# Patient Record
Sex: Female | Born: 1955 | Race: White | Hispanic: No | Smoking: Never smoker
Health system: Southern US, Community
[De-identification: ages and names within clinical notes are randomized; demographics above are authoritative.]

## PROBLEM LIST (undated history)

## (undated) DIAGNOSIS — K5792 Diverticulitis of intestine, part unspecified, without perforation or abscess without bleeding: Secondary | ICD-10-CM

## (undated) DIAGNOSIS — C4491 Basal cell carcinoma of skin, unspecified: Secondary | ICD-10-CM

## (undated) DIAGNOSIS — Z87442 Personal history of urinary calculi: Secondary | ICD-10-CM

## (undated) DIAGNOSIS — M8580 Other specified disorders of bone density and structure, unspecified site: Secondary | ICD-10-CM

## (undated) HISTORY — DX: Diverticulitis of intestine, part unspecified, without perforation or abscess without bleeding: K57.92

## (undated) HISTORY — DX: Basal cell carcinoma of skin, unspecified: C44.91

## (undated) HISTORY — PX: OTHER SURGICAL HISTORY: SHX169

## (undated) HISTORY — DX: Personal history of urinary calculi: Z87.442

## (undated) HISTORY — DX: Other specified disorders of bone density and structure, unspecified site: M85.80

## (undated) HISTORY — PX: EYE SURGERY: SHX253

## (undated) HISTORY — PX: CERVICAL DISC SURGERY: SHX588

---

## 1998-04-15 ENCOUNTER — Other Ambulatory Visit: Admission: RE | Admit: 1998-04-15 | Discharge: 1998-04-15 | Payer: Self-pay | Admitting: *Deleted

## 1999-04-22 ENCOUNTER — Other Ambulatory Visit: Admission: RE | Admit: 1999-04-22 | Discharge: 1999-04-22 | Payer: Self-pay | Admitting: *Deleted

## 2001-06-01 ENCOUNTER — Other Ambulatory Visit: Admission: RE | Admit: 2001-06-01 | Discharge: 2001-06-01 | Payer: Self-pay | Admitting: *Deleted

## 2002-07-31 ENCOUNTER — Other Ambulatory Visit: Admission: RE | Admit: 2002-07-31 | Discharge: 2002-07-31 | Payer: Self-pay | Admitting: *Deleted

## 2004-10-30 ENCOUNTER — Emergency Department (HOSPITAL_COMMUNITY): Admission: EM | Admit: 2004-10-30 | Discharge: 2004-10-30 | Payer: Self-pay | Admitting: Emergency Medicine

## 2005-08-11 ENCOUNTER — Other Ambulatory Visit: Admission: RE | Admit: 2005-08-11 | Discharge: 2005-08-11 | Payer: Self-pay | Admitting: Gynecology

## 2006-01-17 HISTORY — PX: PELVIC LAPAROSCOPY: SHX162

## 2006-08-02 ENCOUNTER — Encounter: Payer: Self-pay | Admitting: Gynecology

## 2006-08-02 ENCOUNTER — Ambulatory Visit (HOSPITAL_BASED_OUTPATIENT_CLINIC_OR_DEPARTMENT_OTHER): Admission: RE | Admit: 2006-08-02 | Discharge: 2006-08-02 | Payer: Self-pay | Admitting: Gynecology

## 2006-08-16 ENCOUNTER — Other Ambulatory Visit: Admission: RE | Admit: 2006-08-16 | Discharge: 2006-08-16 | Payer: Self-pay | Admitting: Gynecology

## 2007-09-03 ENCOUNTER — Other Ambulatory Visit: Admission: RE | Admit: 2007-09-03 | Discharge: 2007-09-03 | Payer: Self-pay | Admitting: Gynecology

## 2007-12-04 ENCOUNTER — Ambulatory Visit: Payer: Self-pay | Admitting: Gynecology

## 2008-05-27 ENCOUNTER — Ambulatory Visit: Payer: Self-pay | Admitting: Gynecology

## 2008-09-10 ENCOUNTER — Ambulatory Visit: Payer: Self-pay | Admitting: Gynecology

## 2008-09-10 ENCOUNTER — Encounter: Payer: Self-pay | Admitting: Gynecology

## 2008-09-10 ENCOUNTER — Other Ambulatory Visit: Admission: RE | Admit: 2008-09-10 | Discharge: 2008-09-10 | Payer: Self-pay | Admitting: Gynecology

## 2009-07-16 ENCOUNTER — Ambulatory Visit: Payer: Self-pay | Admitting: Gynecology

## 2009-09-11 ENCOUNTER — Other Ambulatory Visit: Admission: RE | Admit: 2009-09-11 | Discharge: 2009-09-11 | Payer: Self-pay | Admitting: Gynecology

## 2009-09-11 ENCOUNTER — Ambulatory Visit: Payer: Self-pay | Admitting: Gynecology

## 2010-06-01 NOTE — H&P (Signed)
NAME:  Kaitlyn Tate, Kaitlyn Tate          ACCOUNT NO.:  [id number]   MEDICAL RECORD NO.:  [id number]          PATIENT TYPE:  AMB   LOCATION:  [facility]                         FACILITY:  [facility]   PHYSICIAN:  Timothy P. Fontaine, M.D.DATE OF BIRTH:  [birth year]   DATE OF ADMISSION:  08/02/2006  DATE OF DISCHARGE:                              HISTORY & PHYSICAL   CHIEF COMPLAINT:  Persistent left ovarian cystic masses.   HISTORY OF PRESENT ILLNESS:  This 55 year old gravida 0, barrier  contraception, presents with persistent left cystic ovarian masses.  The  patient originally presented approximately a year ago after having had a  CT scan due to some abdominal discomfort which showed some cystic  changes on her ovaries.  She ultimately underwent ultrasonography which  showed multiple left cystic changes solid in appearance with a  homogeneous echotexture.  These were all smaller, in the 16 mm range.  She initially was followed expectantly with followup ultrasounds  ultimately showing enlargement of these cystic solid areas, the largest  now measuring 34 mm.  She had a CA-125 that was mildly elevated at 45.  She is admitted at this time for laparoscopic assessment left salpingo-  oophorectomy.   PAST MEDICAL HISTORY:  History of renal lithiasis.   PAST SURGICAL HISTORY:  Cervical diskectomy times 2.  Bunionectomy.   ALLERGIES:  No medications.   CURRENT MEDICATIONS:  Aleve p.r.n.   REVIEW OF SYSTEMS:  Noncontributory.   FAMILY HISTORY:  Noncontributory.   SOCIAL HISTORY:  Noncontributory.   PHYSICAL EXAMINATION:  VITAL SIGNS:  Afebrile.  Vital signs are stable.  HEENT: Normal.  LUNGS:  Clear.  CARDIAC:  Regular rate.  No rubs, murmurs or gallops.  ABDOMEN:  Exam benign.  PELVIC:  External BUS, vagina normal.  Cervix normal.  Uterus is normal  size, midline and mobile, nontender.  Adnexa are without gross masses or  tenderness.   ASSESSMENT:  A 55 year old gravida 0 with persistent  mildly enlarging  left multicystic ovary, solid to homogeneous in nature, suspicious for  endometriomas.  CA-125 low-level positive again consistent with  endometriosis.  Options for management were reviewed with the patient to  include continued expectant management versus surgical intervention.  The patient elects for surgical intervention.  She wants a conservative  approach with planned laparoscopic assessment left oophorectomy.  She  understands that, if during the procedure overt carcinoma is  encountered, we will terminate the procedure at that time to allow for  gynecologic oncology consultation and rescheduling of a more appropriate  procedure.  If endometriosis is encountered, we will try to address  endometriosis as found, but we may not eliminate all of this due to its  position and for fear of injury to surrounding structures such as bowel,  bladder, ureters.  If assessment shows completely normal-appearing  ovaries, we are still going to proceed with a left salpingo-oophorectomy  given the long persistence of internal abnormalities, but we will take a  look at the right side regardless.  If there are overt cystic changes,  we will address these to resolve that these are not endometriomas or  pathology, but will intent  to leave her right ovary for continued  hormone production.  We also discussed the issues of rupture, that if it  would be cancer, whether this would or would not change prognosis and  she is comfortable again with a laparoscopic approach.  I reviewed the  expected intraoperative and postoperative courses to include the use of  the laparoscopic trocar placement, multiple port sites, insufflation,  use of sharp blunt dissection, electrocautery and possible use of laser.  The acute intraoperative and postoperative risks were reviewed to  include the risks of bleeding leading to hemorrhage necessitating  transfusion and the risks of transfusion including  transfusion reaction,  hepatitis, HIV, mad cow disease and other unknown entities, risk of  infection internal requiring prolonged antibiotics, incisional requiring  opening and draining of incisions, closure by secondary intention, long-  term risks of hernia formation.  The risks of inadvertent injury to  internal organs either immediately recognized or delay recognized  including bowel, bladder, ureters, vessels and nerves necessitating  major exploratory reparative surgeries, future reparative surgeries,  bowel resection, ostomy formation was all discussed, understood and  accepted.  The patient's questions were answered to her satisfaction.  She is ready to proceed with surgery.      Timothy P. Fontaine, M.D.  Electronically Signed     TPF/MEDQ  D:  07/28/2006  T:  07/29/2006  Job:  [id number]

## 2010-06-01 NOTE — Op Note (Signed)
NAME:  Kaitlyn Tate, Kaitlyn Tate          ACCOUNT NO.:  [id number]   MEDICAL RECORD NO.:  [id number]          PATIENT TYPE:  AMB   LOCATION:  [facility]                         FACILITY:  [facility]   PHYSICIAN:  Timothy P. Fontaine, M.D.DATE OF BIRTH:  [birth year]   DATE OF PROCEDURE:  08/02/2006  DATE OF DISCHARGE:                               OPERATIVE REPORT   PREOPERATIVE DIAGNOSES:  Left ovarian cystic mass.   POSTOPERATIVE DIAGNOSES:  Severe endometriosis.   PROCEDURE:  Diagnostic laparoscopy.   SURGEON:  Timothy P. Fontaine, M.D.   ASSISTANT:  Rande Brunt. Eda Paschal, M.D.   ANESTHESIA:  General.   PATHOLOGY:  Opening cell washing.   ESTIMATED BLOOD LOSS:  Minimal.   COMPLICATIONS:  None.   FINDINGS:  Anterior cul-de-sac with multiple areas of suspected  endometriosis, brown patchy to white plaque.  Posterior cul-de-sac  obliterated with the sigmoid colon adherent from the midline uterus  covering the entire left adnexa.  Peritoneal right sidewall adhesions  along the midline to right posterior mid-uterine wall, again  obliterating the posterior cul-de-sac.  Uterus grossly normal in size.  Small 2-cm anterior pedunculated leiomyomata.  Plaque-like suspected  endometriosis along the posterior uterine surface.  Right fallopian tube  grossly normal, puckered in the mid region, adherent to the posterior  uterine wall, not visualized, distal portion with normal fimbria.  Right  ovary adherent to the posterior uterine surface and right pelvic  sidewall with powder-burn-type implants, again limited inspection,  grossly normal.  Left adnexa:  Initial cystic area, which was freed, was  the distal fallopian tube with normal-appearing fimbria overlying a  cystic mass that was palpated via the probe, although could not be  clearly visualized.  During initial attempt for mobilization, the area  was entered and classic chocolate cyst material extruded; this was  irrigated out and again, could not  clearly visualize normal-appearing  ovarian tissue.  Sharp and blunt investigation revealed firmly-adherent  fibrosis along the posterior uterine aspect and at this point, it was  felt most prudent to abandon any further attempts at adnexal  immobilization and removal.  Her upper abdominal exam showed normal-  appearing appendix, free and mobile at the pelvic brim.  Liver was  smooth.  Gallbladder was not visualized.   PROCEDURE:  The patient was taken to the operating room, properly  identified, underwent general anesthesia, was placed in the low dorsal  lithotomy position, received abdominal, perineal and vaginal preparation  with Betadine solution, bladder emptied with an indwelling Foley  catheterization, EUA performed and a Hulka tenaculum was placed in the  cervix.  The patient was draped in the usual fashion.  A vertical  infraumbilical incision was made.  The 10-mm Optiview-type laparoscopic  trocar was placed under direct visualization and the abdomen was  subsequently insufflated without difficulty.  Right and left 5-mm  suprapubic ports were then placed under direct visualization after  transillumination for the vessels.  Examination of pelvic organs and  upper abdominal exam were carried out with findings noted above.  After  initial attempts at freeing the sigmoid and peritoneal adhesions, it was  felt due to the intense  fibrosis and fix scarring, that was then prudent  to proceed with further attempts at mobilization of the adnexa.  It was  clearly evident that this was a case of severe endometriosis with  endometrioma and the case was therefore terminated.  The patient was  placed in the supine position, the gas was slowly allowed to escape,  adequate hemostasis was visualized.  The right and left suprapubic ports  were removed under direct visualization, again showing adequate  hemostasis.  The infraumbilical port was then backed out under direct  visualization, showing  adequate hemostasis and no evidence of hernia  formation.  All skin incisions were then injected using 0.25% Marcaine.  A 0 Vicryl infraumbilical subcutaneous fascial stitch was placed and all  skin incisions closed with Dermabond skin adhesive.  The Hulka tenaculum  was removed, the Foley catheter was removed, the patient placed in  supine position, awakened without difficulty and taken to the recovery  room in good condition, having tolerated the procedure well.      Timothy P. Fontaine, M.D.  Electronically Signed     TPF/MEDQ  D:  08/02/2006  T:  08/03/2006  Job:  [id number]

## 2010-07-23 ENCOUNTER — Encounter: Payer: Self-pay | Admitting: *Deleted

## 2010-09-13 ENCOUNTER — Encounter: Payer: Self-pay | Admitting: Gynecology

## 2010-09-14 ENCOUNTER — Encounter: Payer: Self-pay | Admitting: Gynecology

## 2010-09-14 ENCOUNTER — Ambulatory Visit (INDEPENDENT_AMBULATORY_CARE_PROVIDER_SITE_OTHER): Payer: BC Managed Care – PPO | Admitting: Gynecology

## 2010-09-14 ENCOUNTER — Other Ambulatory Visit (HOSPITAL_COMMUNITY)
Admission: RE | Admit: 2010-09-14 | Discharge: 2010-09-14 | Disposition: A | Discharge status: Home / Self Care | Payer: BC Managed Care – PPO | Source: Ambulatory Visit | Attending: Gynecology | Admitting: Gynecology

## 2010-09-14 VITALS — BP 120/70 | Ht 64.75 in | Wt 140.0 lb

## 2010-09-14 DIAGNOSIS — Z01419 Encounter for gynecological examination (general) (routine) without abnormal findings: Secondary | ICD-10-CM

## 2010-09-14 NOTE — Progress Notes (Signed)
Kaitlyn Tate November 06, 1955 [id number]        55 y.o.  for annual exam.  Postmenopausal doing well some hot flashes which are treated with soy-based OTC.  Has history of significant endometriosis which is not an issue to her now.  Past medical history,surgical history, allergies, family history and social history were all reviewed and documented in the EPIC chart. ROS:  Was performed and pertinent positives and negatives are included in the history.  Exam: chaperone present Filed Vitals:   09/14/10 0937  BP: 120/70   General appearance  Normal Skin grossly normal Head/Neck normal with no cervical or supraclavicular adenopathy thyroid normal Lungs  clear Cardiac RR, without RMG Abdominal  soft, nontender, without masses, organomegaly or hernia Breasts  examined lying and sitting without masses, retractions, discharge or axillary adenopathy. Pelvic  Ext/BUS/vagina  normal mild atrophic changes noted  Cervix  normal  Pap done  Uterus  anteverted, normal size, shape and contour, midline and mobile nontender   Adnexa  Without masses or tenderness    Anus and perineum  normal   Rectovaginal  normal sphincter tone without palpated masses or tenderness.    Assessment/Plan:  55 y.o. female for annual exam.  Postmenopausal doing well no bleeding. She has mild atrophic genital changes. These are not an issue to her now.  Had mammography May 2012 will continue with annual mammography.  Up to date with colonoscopy. Recommended she schedule a baseline DEXA and she wants to arrange in [place] through her primary's office. She has an appointment to see them next week for her annual exam. No blood work was done today as it is all done through their office. Increase calcium vitamin D reviewed. Assuming she continues well she'll see me in a year sooner as needed    Dara Lords MD, 10:08 AM 09/14/2010

## 2011-05-18 DIAGNOSIS — M8580 Other specified disorders of bone density and structure, unspecified site: Secondary | ICD-10-CM

## 2011-05-18 HISTORY — DX: Other specified disorders of bone density and structure, unspecified site: M85.80

## 2011-09-15 ENCOUNTER — Encounter: Payer: Self-pay | Admitting: Gynecology

## 2011-09-15 ENCOUNTER — Ambulatory Visit (INDEPENDENT_AMBULATORY_CARE_PROVIDER_SITE_OTHER): Payer: BC Managed Care – PPO | Admitting: Gynecology

## 2011-09-15 VITALS — BP 120/70 | Ht 64.5 in | Wt 138.0 lb

## 2011-09-15 DIAGNOSIS — Z01419 Encounter for gynecological examination (general) (routine) without abnormal findings: Secondary | ICD-10-CM

## 2011-09-15 DIAGNOSIS — N952 Postmenopausal atrophic vaginitis: Secondary | ICD-10-CM

## 2011-09-15 DIAGNOSIS — N951 Menopausal and female climacteric states: Secondary | ICD-10-CM

## 2011-09-15 NOTE — Progress Notes (Signed)
Kaitlyn Tate 02-Sep-1955 [id number]        56 y.o.  G0P0 for annual exam.  Doing well without complaints.  Past medical history,surgical history, medications, allergies, family history and social history were all reviewed and documented in the EPIC chart. ROS:  Was performed and pertinent positives and negatives are included in the history.  Exam: Sherrilyn Rist assistant Filed Vitals:   09/15/11 0909  BP: 120/70  Height: 5' 4.5" (1.638 m)  Weight: 138 lb (62.596 kg)   General appearance  Normal Skin grossly normal Head/Neck normal with no cervical or supraclavicular adenopathy thyroid normal Lungs  clear Cardiac RR, without RMG Abdominal  soft, nontender, without masses, organomegaly or hernia Breasts  examined lying and sitting without masses, retractions, discharge or axillary adenopathy. Pelvic  Ext/BUS/vagina  normal with mild atrophic changes.  Cervix  normal   Uterus  anteverted, normal size, shape and contour, midline and mobile nontender   Adnexa  Without masses or tenderness    Anus and perineum  normal   Rectovaginal  normal sphincter tone without palpated masses or tenderness.    Assessment/Plan:  56 y.o. G0P0 female for annual exam.   1. Postmenopausal. Patient with some mild hot flushes. Better than last year. Overall doing well we'll continue to monitor. No bleeding. 2. History severe endometriosis. Patient asymptomatic will continue to monitor. 3. Osteopenia. DEXA this year elsewhere showed osteopenia. She's not sure the numbers have asked her to get me a copy of the report. She did discuss this with her primary physician who is treating her with calcium vitamin D supplementation. 4. Mammography. Patient had mammogram earlier this year. We'll continue with annual mammography. SBE monthly reviewed. 5. Pap smear. Last Pap smear 2012. No Pap smear done today. Patient has no history of abnormal Pap smears with numerous normal reports in her chart. Discussed current  screening guidelines and will plan every 3-5 year screening. 6. Colonoscopy. Patient had at age 56. The patient's sister did have colon cancer. She was recommended to repeat in a 10 year interval but my recommendation would be to readdress this with her gastroenterologist at age 56 as this may be more appropriate interval with the family history. 7. Health maintenance. Patient is followed by her primary annually. No blood work done today. Follow up one year, sooner as needed   Dara Lords MD, 9:35 AM 09/15/2011

## 2011-09-15 NOTE — Patient Instructions (Signed)
Follow up in one year for annual gynecologic exam. 

## 2011-09-16 LAB — URINALYSIS W MICROSCOPIC + REFLEX CULTURE
Bilirubin Urine: NEGATIVE
Casts: NONE SEEN
Crystals: NONE SEEN
Glucose, UA: NEGATIVE mg/dL
Hgb urine dipstick: NEGATIVE
Ketones, ur: NEGATIVE mg/dL
Leukocytes, UA: NEGATIVE
Nitrite: NEGATIVE
Protein, ur: NEGATIVE mg/dL
Specific Gravity, Urine: 1.021 (ref 1.005–1.030)
Squamous Epithelial / LPF: NONE SEEN
Urobilinogen, UA: 0.2 mg/dL (ref 0.0–1.0)
pH: 5.5 (ref 5.0–8.0)

## 2012-09-20 ENCOUNTER — Other Ambulatory Visit (HOSPITAL_COMMUNITY)
Admission: RE | Admit: 2012-09-20 | Discharge: 2012-09-20 | Disposition: A | Discharge status: Home / Self Care | Payer: BC Managed Care – PPO | Source: Ambulatory Visit | Attending: Gynecology | Admitting: Gynecology

## 2012-09-20 ENCOUNTER — Encounter: Payer: Self-pay | Admitting: Gynecology

## 2012-09-20 ENCOUNTER — Ambulatory Visit (INDEPENDENT_AMBULATORY_CARE_PROVIDER_SITE_OTHER): Payer: BC Managed Care – PPO | Admitting: Gynecology

## 2012-09-20 VITALS — BP 126/78 | Ht 64.5 in | Wt 139.2 lb

## 2012-09-20 DIAGNOSIS — M8580 Other specified disorders of bone density and structure, unspecified site: Secondary | ICD-10-CM

## 2012-09-20 DIAGNOSIS — Z01419 Encounter for gynecological examination (general) (routine) without abnormal findings: Secondary | ICD-10-CM

## 2012-09-20 DIAGNOSIS — M899 Disorder of bone, unspecified: Secondary | ICD-10-CM

## 2012-09-20 NOTE — Patient Instructions (Signed)
Follow up in one year for annual exam 

## 2012-09-20 NOTE — Addendum Note (Signed)
Addended by: Bertram Savin A on: 09/20/2012 09:34 AM   Modules accepted: Orders

## 2012-09-20 NOTE — Progress Notes (Signed)
Kaitlyn Tate 07-26-1955 [id number]        57 y.o.  G0P0 for annual exam.  Doing well without complaints.  Past medical history,surgical history, medications, allergies, family history and social history were all reviewed and documented in the EPIC chart.  ROS:  Performed and pertinent positives and negatives are included in the history, assessment and plan .  Exam: Kim assistant Filed Vitals:   09/20/12 0859  BP: 126/78  Height: 5' 4.5" (1.638 m)  Weight: 139 lb 3.2 oz (63.141 kg)   General appearance  Normal Skin grossly normal Head/Neck normal with no cervical or supraclavicular adenopathy thyroid normal Lungs  clear Cardiac RR, without RMG Abdominal  soft, nontender, without masses, organomegaly or hernia Breasts  examined lying and sitting without masses, retractions, discharge or axillary adenopathy. Pelvic  Ext/BUS/vagina  normal  Cervix  normal Pap done  Uterus  anteverted, normal size, shape and contour, midline and mobile nontender   Adnexa  Without masses or tenderness    Anus and perineum  normal   Rectovaginal  normal sphincter tone without palpated masses or tenderness.    Assessment/Plan:  57 y.o. G0P0 female for annual exam.   1. Postmenopausal. Some hot flashes and sweats but overall doing well. Does not want to consider HRT. No bleeding. We'll continue to monitor. Patient knows to report any bleeding. 2. Osteopenia. DEXA 05/2011 with T score -1.3 FRAX 4.7%/0.1%. Increase calcium vitamin D reviewed. Plan repeat DEXA next year. 3. Pap smear 2012. Pap done today at patient's request. Reviewed current screening guidelines. No history of abnormal Pap smears previously. 4. Mammography 05/2012. Continue with annual mammography. SBE monthly reviewed. 5. Colonoscopy 4 years ago. Sister with colon cancer. Followup next year for repeat. 6. Health maintenance. No blood work done today as it's all done through her primary physician's office. Followup one year, sooner  as needed.  Note: This document was prepared with digital dictation and possible smart phrase technology. Any transcriptional errors that result from this process are unintentional.   Dara Lords MD, 9:18 AM 09/20/2012

## 2012-09-21 LAB — URINALYSIS W MICROSCOPIC + REFLEX CULTURE
Bacteria, UA: NONE SEEN
Bilirubin Urine: NEGATIVE
Casts: NONE SEEN
Crystals: NONE SEEN
Glucose, UA: NEGATIVE mg/dL
Hgb urine dipstick: NEGATIVE
Ketones, ur: NEGATIVE mg/dL
Leukocytes, UA: NEGATIVE
Nitrite: NEGATIVE
Protein, ur: NEGATIVE mg/dL
Specific Gravity, Urine: 1.01 (ref 1.005–1.030)
Squamous Epithelial / HPF: NONE SEEN
Urobilinogen, UA: 0.2 mg/dL (ref 0.0–1.0)
pH: 6.5 (ref 5.0–8.0)

## 2013-05-18 ENCOUNTER — Emergency Department (HOSPITAL_BASED_OUTPATIENT_CLINIC_OR_DEPARTMENT_OTHER)
Admission: EM | Admit: 2013-05-18 | Discharge: 2013-05-18 | Disposition: A | Discharge status: Home / Self Care | Payer: BC Managed Care – PPO | Attending: Emergency Medicine | Admitting: Emergency Medicine

## 2013-05-18 ENCOUNTER — Encounter (HOSPITAL_BASED_OUTPATIENT_CLINIC_OR_DEPARTMENT_OTHER): Payer: Self-pay | Admitting: Emergency Medicine

## 2013-05-18 DIAGNOSIS — Z8742 Personal history of other diseases of the female genital tract: Secondary | ICD-10-CM | POA: Insufficient documentation

## 2013-05-18 DIAGNOSIS — M899 Disorder of bone, unspecified: Secondary | ICD-10-CM | POA: Insufficient documentation

## 2013-05-18 DIAGNOSIS — L089 Local infection of the skin and subcutaneous tissue, unspecified: Secondary | ICD-10-CM

## 2013-05-18 DIAGNOSIS — J45909 Unspecified asthma, uncomplicated: Secondary | ICD-10-CM | POA: Insufficient documentation

## 2013-05-18 DIAGNOSIS — M949 Disorder of cartilage, unspecified: Secondary | ICD-10-CM

## 2013-05-18 DIAGNOSIS — Z87442 Personal history of urinary calculi: Secondary | ICD-10-CM | POA: Insufficient documentation

## 2013-05-18 MED ORDER — SULFAMETHOXAZOLE-TRIMETHOPRIM 800-160 MG PO TABS: 1.0000 | ORAL_TABLET | Freq: Two times a day (BID) | ORAL | Status: AC

## 2013-05-18 NOTE — ED Provider Notes (Signed)
CSN: [id number]     Arrival date & time 05/18/13  2041 History   First MD Initiated Contact with Patient 05/18/13 2134     Chief Complaint  Patient presents with  . Cellulitis     (Consider location/radiation/quality/duration/timing/severity/associated sxs/prior Treatment) Patient is a 58 y.o. female presenting with rash. The history is provided by the patient. No language interpreter was used.  Rash Location:  Shoulder/arm Quality: painful and redness   Pain details:    Quality:  Aching   Onset quality:  Gradual   Timing:  Constant Severity:  Moderate Timing:  Constant Progression:  Worsening Chronicity:  New Relieved by:  Nothing Worsened by:  Nothing tried Ineffective treatments:  None tried   Past Medical History  Diagnosis Date  . Endometriosis 07/2006    SEVERE  . History of renal stone   . Asthma     MILD  . Osteopenia 05/2011    T score -1.3 FRAX 4.7%/0.1%   Past Surgical History  Procedure Laterality Date  . Cervical disc surgery      x2 AND FUSION IN 01/2009  . Bunion removed    . Eye surgery  bi-lat lasik surgery 2006  . Pelvic laparoscopy  2008    endometriosis   Family History  Problem Relation Age of Onset  . Hypertension Mother   . Diabetes Mother   . Diverticulitis Mother   . Cancer Sister     COLON   History  Substance Use Topics  . Smoking status: Never Smoker   . Smokeless tobacco: Never Used  . Alcohol Use: No     Comment: very seldom   OB History   Grav Para Term Preterm Abortions TAB SAB Ect Mult Living   0 0             Review of Systems  Skin: Positive for wound.  All other systems reviewed and are negative.     Allergies  Amoxicillin-pot clavulanate; Avelox; and Crab  Home Medications   Prior to Admission medications   Medication Sig Start Date End Date Taking? Authorizing Provider  Calcium Carbonate-Vit D-Min 1200-1000 MG-UNIT CHEW Chew by mouth.      Historical Provider, MD  Flaxseed, Linseed, (FLAX SEEDS PO)  Take by mouth.      Historical Provider, MD  Ibuprofen (ADVIL PO) Take by mouth. AS NEEDED      Historical Provider, MD  Multiple Vitamin (MULTIVITAMIN) tablet Take 1 tablet by mouth daily.      Historical Provider, MD  Naproxen Sodium (ALEVE PO) Take by mouth. AS NEEDED      Historical Provider, MD  Omega-3 Fatty Acids (FISH OIL PO) Take by mouth.      Historical Provider, MD  OVER THE COUNTER MEDICATION gnc supplement--phytoestrogen     Historical Provider, MD  Probiotic Product (PROBIOTIC FORMULA PO) Take by mouth.      Historical Provider, MD   BP 162/77  Pulse 76  Temp(Src) 98.3 F (36.8 C) (Oral)  Resp 20  Ht 5\' 4"  (1.626 m)  Wt 143 lb (64.864 kg)  BMI 24.53 kg/m2  SpO2 100%  LMP 09/13/2008 Physical Exam  Nursing note and vitals reviewed. Constitutional: She appears well-developed and well-nourished.  HENT:  Head: Normocephalic.  Eyes: Pupils are equal, round, and reactive to light.  Cardiovascular: Normal rate.   Abdominal: Soft.  Neurological: She is alert. She has normal reflexes.  Skin: There is erythema.  Psychiatric: She has a normal mood and affect.  ED Course  Procedures (including critical care time) Labs Review Labs Reviewed - No data to display  Imaging Review No results found.   EKG Interpretation None      MDM   Final diagnoses:  Skin infection    Bactrim DS  Fransico Meadow, PA-C 05/18/13 2214

## 2013-05-18 NOTE — Discharge Instructions (Signed)
Cellulitis Cellulitis is an infection of the skin and the tissue beneath it. The infected area is usually red and tender. Cellulitis occurs most often in the arms and lower legs.  CAUSES  Cellulitis is caused by bacteria that enter the skin through cracks or cuts in the skin. The most common types of bacteria that cause cellulitis are Staphylococcus and Streptococcus. SYMPTOMS   Redness and warmth.  Swelling.  Tenderness or pain.  Fever. DIAGNOSIS  Your caregiver can usually determine what is wrong based on a physical exam. Blood tests may also be done. TREATMENT  Treatment usually involves taking an antibiotic medicine. HOME CARE INSTRUCTIONS   Take your antibiotics as directed. Finish them even if you start to feel better.  Keep the infected arm or leg elevated to reduce swelling.  Apply a warm cloth to the affected area up to 4 times per day to relieve pain.  Only take over-the-counter or prescription medicines for pain, discomfort, or fever as directed by your caregiver.  Keep all follow-up appointments as directed by your caregiver. SEEK MEDICAL CARE IF:   You notice red streaks coming from the infected area.  Your red area gets larger or turns dark in color.  Your bone or joint underneath the infected area becomes painful after the skin has healed.  Your infection returns in the same area or another area.  You notice a swollen bump in the infected area.  You develop new symptoms. SEEK IMMEDIATE MEDICAL CARE IF:   You have a fever.  You feel very sleepy.  You develop vomiting or diarrhea.  You have a general ill feeling (malaise) with muscle aches and pains. MAKE SURE YOU:   Understand these instructions.  Will watch your condition.  Will get help right away if you are not doing well or get worse. Document Released: 10/13/2004 Document Revised: 07/05/2011 Document Reviewed: 03/21/2011 [employer] Patient Information 2014 [employer].  

## 2013-05-18 NOTE — ED Notes (Signed)
Bitten or stung on her right lower forearm last night.  Swelling, redness, and oozing noted now.

## 2013-05-19 NOTE — ED Provider Notes (Signed)
Medical screening examination/treatment/procedure(s) were performed by non-physician practitioner and as supervising physician I was immediately available for consultation/collaboration.    Dot Lanes, MD 05/19/13 1520

## 2013-07-08 ENCOUNTER — Other Ambulatory Visit: Payer: Self-pay | Admitting: Gastroenterology

## 2013-09-25 ENCOUNTER — Encounter (HOSPITAL_COMMUNITY): Payer: Self-pay | Admitting: Pharmacy Technician

## 2013-09-26 ENCOUNTER — Ambulatory Visit (INDEPENDENT_AMBULATORY_CARE_PROVIDER_SITE_OTHER): Payer: BC Managed Care – PPO | Admitting: Gynecology

## 2013-09-26 ENCOUNTER — Encounter: Payer: Self-pay | Admitting: Gynecology

## 2013-09-26 VITALS — BP 128/74 | Ht 64.25 in | Wt 140.6 lb

## 2013-09-26 DIAGNOSIS — M8580 Other specified disorders of bone density and structure, unspecified site: Secondary | ICD-10-CM

## 2013-09-26 DIAGNOSIS — Z01419 Encounter for gynecological examination (general) (routine) without abnormal findings: Secondary | ICD-10-CM

## 2013-09-26 DIAGNOSIS — M899 Disorder of bone, unspecified: Secondary | ICD-10-CM

## 2013-09-26 DIAGNOSIS — N952 Postmenopausal atrophic vaginitis: Secondary | ICD-10-CM

## 2013-09-26 DIAGNOSIS — M949 Disorder of cartilage, unspecified: Secondary | ICD-10-CM

## 2013-09-26 NOTE — Patient Instructions (Signed)
You may obtain a copy of any labs that were done today by logging onto MyChart as outlined in the instructions provided with your AVS (after visit summary). The office will not call with normal lab results but certainly if there are any significant abnormalities then we will contact you.   Health Maintenance, Female A healthy lifestyle and preventative care can promote health and wellness.  Maintain regular health, dental, and eye exams.  Eat a healthy diet. Foods like vegetables, fruits, whole grains, low-fat dairy products, and lean protein foods contain the nutrients you need without too many calories. Decrease your intake of foods high in solid fats, added sugars, and salt. Get information about a proper diet from your caregiver, if necessary.  Regular physical exercise is one of the most important things you can do for your health. Most adults should get at least 150 minutes of moderate-intensity exercise (any activity that increases your heart rate and causes you to sweat) each week. In addition, most adults need muscle-strengthening exercises on 2 or more days a week.   Maintain a healthy weight. The body mass index (BMI) is a screening tool to identify possible weight problems. It provides an estimate of body fat based on height and weight. Your caregiver can help determine your BMI, and can help you achieve or maintain a healthy weight. For adults 20 years and older:  A BMI below 18.5 is considered underweight.  A BMI of 18.5 to 24.9 is normal.  A BMI of 25 to 29.9 is considered overweight.  A BMI of 30 and above is considered obese.  Maintain normal blood lipids and cholesterol by exercising and minimizing your intake of saturated fat. Eat a balanced diet with plenty of fruits and vegetables. Blood tests for lipids and cholesterol should begin at age 61 and be repeated every 5 years. If your lipid or cholesterol levels are high, you are over 50, or you are a high risk for heart  disease, you may need your cholesterol levels checked more frequently.Ongoing high lipid and cholesterol levels should be treated with medicines if diet and exercise are not effective.  If you smoke, find out from your caregiver how to quit. If you do not use tobacco, do not start.  Lung cancer screening is recommended for adults aged 33 80 years who are at high risk for developing lung cancer because of a history of smoking. Yearly low-dose computed tomography (CT) is recommended for people who have at least a 30-pack-year history of smoking and are a current smoker or have quit within the past 15 years. A pack year of smoking is smoking an average of 1 pack of cigarettes a day for 1 year (for example: 1 pack a day for 30 years or 2 packs a day for 15 years). Yearly screening should continue until the smoker has stopped smoking for at least 15 years. Yearly screening should also be stopped for people who develop a health problem that would prevent them from having lung cancer treatment.  If you are pregnant, do not drink alcohol. If you are breastfeeding, be very cautious about drinking alcohol. If you are not pregnant and choose to drink alcohol, do not exceed 1 drink per day. One drink is considered to be 12 ounces (355 mL) of beer, 5 ounces (148 mL) of wine, or 1.5 ounces (44 mL) of liquor.  Avoid use of street drugs. Do not share needles with anyone. Ask for help if you need support or instructions about stopping  the use of drugs.  High blood pressure causes heart disease and increases the risk of stroke. Blood pressure should be checked at least every 1 to 2 years. Ongoing high blood pressure should be treated with medicines, if weight loss and exercise are not effective.  If you are 59 to 56 years old, ask your caregiver if you should take aspirin to prevent strokes.  Diabetes screening involves taking a blood sample to check your fasting blood sugar level. This should be done once every 3  years, after age 91, if you are within normal weight and without risk factors for diabetes. Testing should be considered at a younger age or be carried out more frequently if you are overweight and have at least 1 risk factor for diabetes.  Breast cancer screening is essential preventative care for women. You should practice "breast self-awareness." This means understanding the normal appearance and feel of your breasts and may include breast self-examination. Any changes detected, no matter how small, should be reported to a caregiver. Women in their 66s and 30s should have a clinical breast exam (CBE) by a caregiver as part of a regular health exam every 1 to 3 years. After age 101, women should have a CBE every year. Starting at age 100, women should consider having a mammogram (breast X-ray) every year. Women who have a family history of breast cancer should talk to their caregiver about genetic screening. Women at a high risk of breast cancer should talk to their caregiver about having an MRI and a mammogram every year.  Breast cancer gene (BRCA)-related cancer risk assessment is recommended for women who have family members with BRCA-related cancers. BRCA-related cancers include breast, ovarian, tubal, and peritoneal cancers. Having family members with these cancers may be associated with an increased risk for harmful changes (mutations) in the breast cancer genes BRCA1 and BRCA2. Results of the assessment will determine the need for genetic counseling and BRCA1 and BRCA2 testing.  The Pap test is a screening test for cervical cancer. Women should have a Pap test starting at age 57. Between ages 25 and 35, Pap tests should be repeated every 2 years. Beginning at age 37, you should have a Pap test every 3 years as long as the past 3 Pap tests have been normal. If you had a hysterectomy for a problem that was not cancer or a condition that could lead to cancer, then you no longer need Pap tests. If you are  between ages 50 and 76, and you have had normal Pap tests going back 10 years, you no longer need Pap tests. If you have had past treatment for cervical cancer or a condition that could lead to cancer, you need Pap tests and screening for cancer for at least 20 years after your treatment. If Pap tests have been discontinued, risk factors (such as a new sexual partner) need to be reassessed to determine if screening should be resumed. Some women have medical problems that increase the chance of getting cervical cancer. In these cases, your caregiver may recommend more frequent screening and Pap tests.  The human papillomavirus (HPV) test is an additional test that may be used for cervical cancer screening. The HPV test looks for the virus that can cause the cell changes on the cervix. The cells collected during the Pap test can be tested for HPV. The HPV test could be used to screen women aged 44 years and older, and should be used in women of any age  who have unclear Pap test results. After the age of 55, women should have HPV testing at the same frequency as a Pap test.  Colorectal cancer can be detected and often prevented. Most routine colorectal cancer screening begins at the age of 44 and continues through age 20. However, your caregiver may recommend screening at an earlier age if you have risk factors for colon cancer. On a yearly basis, your caregiver may provide home test kits to check for hidden blood in the stool. Use of a small camera at the end of a tube, to directly examine the colon (sigmoidoscopy or colonoscopy), can detect the earliest forms of colorectal cancer. Talk to your caregiver about this at age 86, when routine screening begins. Direct examination of the colon should be repeated every 5 to 10 years through age 13, unless early forms of pre-cancerous polyps or small growths are found.  Hepatitis C blood testing is recommended for all people born from [birth year] through 1963 and any  individual with known risks for hepatitis C.  Practice safe sex. Use condoms and avoid high-risk sexual practices to reduce the spread of sexually transmitted infections (STIs). Sexually active women aged 36 and younger should be checked for Chlamydia, which is a common sexually transmitted infection. Older women with new or multiple partners should also be tested for Chlamydia. Testing for other STIs is recommended if you are sexually active and at increased risk.  Osteoporosis is a disease in which the bones lose minerals and strength with aging. This can result in serious bone fractures. The risk of osteoporosis can be identified using a bone density scan. Women ages 20 and over and women at risk for fractures or osteoporosis should discuss screening with their caregivers. Ask your caregiver whether you should be taking a calcium supplement or vitamin D to reduce the rate of osteoporosis.  Menopause can be associated with physical symptoms and risks. Hormone replacement therapy is available to decrease symptoms and risks. You should talk to your caregiver about whether hormone replacement therapy is right for you.  Use sunscreen. Apply sunscreen liberally and repeatedly throughout the day. You should seek shade when your shadow is shorter than you. Protect yourself by wearing long sleeves, pants, a wide-brimmed hat, and sunglasses year round, whenever you are outdoors.  Notify your caregiver of new moles or changes in moles, especially if there is a change in shape or color. Also notify your caregiver if a mole is larger than the size of a pencil eraser.  Stay current with your immunizations. Document Released: 07/19/2010 Document Revised: 04/30/2012 Document Reviewed: 07/19/2010 [facility] Patient Information 2014 [employer].

## 2013-09-26 NOTE — Progress Notes (Signed)
Kaitlyn Tate 1955-06-27 [id number]        58 y.o.  G0P0 for annual exam.  Doing well.  Past medical history,surgical history, problem list, medications, allergies, family history and social history were all reviewed and documented as reviewed in the EPIC chart.  ROS:  12 system ROS performed with pertinent positives and negatives included in the history, assessment and plan.   Additional significant findings :  none   Exam: Engineer, drilling Vitals:   09/26/13 0859  BP: 128/74  Height: 5' 4.25" (1.632 m)  Weight: 140 lb 9.6 oz (63.776 kg)   General appearance:  Normal affect, orientation and appearance. Skin: Grossly normal HEENT: Without gross lesions.  No cervical or supraclavicular adenopathy. Thyroid normal.  Lungs:  Clear without wheezing, rales or rhonchi Cardiac: RR, without RMG Abdominal:  Soft, nontender, without masses, guarding, rebound, organomegaly or hernia Breasts:  Examined lying and sitting without masses, retractions, discharge or axillary adenopathy. Pelvic:  Ext/BUS/vagina with generalized atrophic changes  Cervix with atrophic changes  Uterus anteverted, normal size, shape and contour, midline and mobile nontender   Adnexa  Without masses or tenderness    Anus and perineum  Normal   Rectovaginal  Normal sphincter tone without palpated masses or tenderness.    Assessment/Plan:  58 y.o. G0P0 female for annual exam.   1. Postmenopausal/atrophic genital changes. Patient doing well without significant symptoms of hot flushes, night sweats or vaginal dryness. No vaginal bleeding. Is not sexually active. Continue to monitor. Report any vaginal bleeding. 2. Pap smear 2014. No Pap smear done today. No history of significant abnormal Pap smears. Reviewed current screening guidelines. Will plan repeat Pap smear at 3 year interval. 3. Mammography 05/2013. Continue with annual mammography. SBE monthly reviewed. 4. Colonoscopy scheduled end of this month at 5  your interval due to her sister's history of intestinal cancer. 5. Osteopenia. DEXA 05/2011 T score -1.3 FRAX 4.7%/0.1%. Patient will repeat her DEXA this coming year when she does her mammogram. Increase calcium and vitamin D recommendations reviewed. 6. Health maintenance. No routine blood work done and she has this done at her primary physician's office. Follow up one year, sooner as needed.   Note: This document was prepared with digital dictation and possible smart phrase technology. Any transcriptional errors that result from this process are unintentional.   [name], 9:20 AM 09/26/2013

## 2013-09-27 LAB — URINALYSIS W MICROSCOPIC + REFLEX CULTURE
Bacteria, UA: NONE SEEN
Bilirubin Urine: NEGATIVE
Casts: NONE SEEN
Crystals: NONE SEEN
Glucose, UA: NEGATIVE mg/dL
Hgb urine dipstick: NEGATIVE
Ketones, ur: NEGATIVE mg/dL
Leukocytes, UA: NEGATIVE
Nitrite: NEGATIVE
Protein, ur: NEGATIVE mg/dL
Specific Gravity, Urine: 1.029 (ref 1.005–1.030)
Squamous Epithelial / HPF: NONE SEEN
Urobilinogen, UA: 0.2 mg/dL (ref 0.0–1.0)
pH: 5.5 (ref 5.0–8.0)

## 2013-10-07 ENCOUNTER — Encounter (HOSPITAL_COMMUNITY): Payer: Self-pay | Admitting: *Deleted

## 2013-10-08 ENCOUNTER — Other Ambulatory Visit: Payer: Self-pay | Admitting: Gastroenterology

## 2013-10-14 ENCOUNTER — Ambulatory Visit (HOSPITAL_COMMUNITY): Payer: BC Managed Care – PPO | Admitting: Anesthesiology

## 2013-10-14 ENCOUNTER — Encounter (HOSPITAL_COMMUNITY): Payer: Self-pay | Admitting: *Deleted

## 2013-10-14 ENCOUNTER — Ambulatory Visit (HOSPITAL_COMMUNITY)
Admission: RE | Admit: 2013-10-14 | Discharge: 2013-10-14 | Disposition: A | Discharge status: Home / Self Care | Payer: BC Managed Care – PPO | Source: Ambulatory Visit | Attending: Gastroenterology | Admitting: Gastroenterology

## 2013-10-14 ENCOUNTER — Encounter (HOSPITAL_COMMUNITY): Payer: BC Managed Care – PPO | Admitting: Anesthesiology

## 2013-10-14 ENCOUNTER — Encounter (HOSPITAL_COMMUNITY)
Admission: RE | Disposition: A | Discharge status: Home / Self Care | Payer: Self-pay | Source: Ambulatory Visit | Attending: Gastroenterology

## 2013-10-14 DIAGNOSIS — Z800 Family history of malignant neoplasm of digestive organs: Secondary | ICD-10-CM | POA: Insufficient documentation

## 2013-10-14 DIAGNOSIS — Z1211 Encounter for screening for malignant neoplasm of colon: Secondary | ICD-10-CM | POA: Diagnosis not present

## 2013-10-14 HISTORY — PX: COLONOSCOPY WITH PROPOFOL: SHX5780

## 2013-10-14 SURGERY — COLONOSCOPY WITH PROPOFOL
Anesthesia: Monitor Anesthesia Care

## 2013-10-14 MED ORDER — SODIUM CHLORIDE 0.9 % IV SOLN: INTRAVENOUS | Status: DC

## 2013-10-14 MED ORDER — LACTATED RINGERS IV SOLN
INTRAVENOUS | Status: DC
  Administered 2013-10-14: 09:00:00 via INTRAVENOUS

## 2013-10-14 MED ORDER — PROPOFOL 10 MG/ML IV BOLUS
INTRAVENOUS | Status: AC
  Filled 2013-10-14: qty 20

## 2013-10-14 MED ORDER — PROPOFOL 10 MG/ML IV BOLUS
INTRAVENOUS | Status: DC | PRN
  Administered 2013-10-14 (×2): 50 mg via INTRAVENOUS
  Administered 2013-10-14: 25 mg via INTRAVENOUS
  Administered 2013-10-14: 50 mg via INTRAVENOUS

## 2013-10-14 SURGICAL SUPPLY — 22 items

## 2013-10-14 NOTE — Discharge Instructions (Signed)

## 2013-10-14 NOTE — Anesthesia Preprocedure Evaluation (Addendum)
Anesthesia Evaluation  Patient identified by MRN, date of birth, ID band Patient awake    Reviewed: Allergy & Precautions, H&P , NPO status , Patient's Chart, lab work & pertinent test results  Airway Mallampati: II TM Distance: >3 FB Neck ROM: Full    Dental no notable dental hx.    Pulmonary asthma ,  breath sounds clear to auscultation  Pulmonary exam normal       Cardiovascular negative cardio ROS  Rhythm:Regular Rate:Normal     Neuro/Psych negative neurological ROS  negative psych ROS   GI/Hepatic negative GI ROS, Neg liver ROS,   Endo/Other  negative endocrine ROS  Renal/GU negative Renal ROS  negative genitourinary   Musculoskeletal negative musculoskeletal ROS (+)   Abdominal   Peds negative pediatric ROS (+)  Hematology negative hematology ROS (+)   Anesthesia Other Findings   Reproductive/Obstetrics negative OB ROS                           Anesthesia Physical Anesthesia Plan  ASA: II  Anesthesia Plan: MAC   Post-op Pain Management:    Induction: Intravenous  Airway Management Planned: Simple Face Mask  Additional Equipment:   Intra-op Plan:   Post-operative Plan:   Informed Consent: I have reviewed the patients History and Physical, chart, labs and discussed the procedure including the risks, benefits and alternatives for the proposed anesthesia with the patient or authorized representative who has indicated his/her understanding and acceptance.   Dental advisory given  Plan Discussed with: CRNA  Anesthesia Plan Comments:         Anesthesia Quick Evaluation

## 2013-10-14 NOTE — Anesthesia Postprocedure Evaluation (Signed)
  Anesthesia Post-op Note  Patient: Kaitlyn Tate  Procedure(s) Performed: Procedure(s) (LRB): COLONOSCOPY WITH PROPOFOL (N/A)  Patient Location: PACU  Anesthesia Type: MAC  Level of Consciousness: awake and alert   Airway and Oxygen Therapy: Patient Spontanous Breathing  Post-op Pain: mild  Post-op Assessment: Post-op Vital signs reviewed, Patient's Cardiovascular Status Stable, Respiratory Function Stable, Patent Airway and No signs of Nausea or vomiting  Last Vitals:  Filed Vitals:   10/14/13 1150  BP: 149/72  Pulse: 67  Temp:   Resp: 15    Post-op Vital Signs: stable   Complications: No apparent anesthesia complications

## 2013-10-14 NOTE — Op Note (Signed)
Procedure: Screening colonoscopy. Normal screening colonoscopy performed on 05/30/2008. Sister diagnosed with appendiceal cancer under the age of 72.  Endoscopist: Earle Gell  Premedication: Propofol administered by anesthesia  Procedure: The patient was placed in the left lateral decubitus position. Anal inspection and digital rectal exam were normal. The Pentax pediatric colonoscope was introduced into the rectum and advanced to the cecum. A normal-appearing appendiceal orifice was identified. A normal-appearing ileocecal valve was identified. Colonic preparation for the exam today was good.  Rectum. Normal. Retroflexed view of the distal rectum normal  Sigmoid colon and descending colon. Normal  Splenic flexure. Normal  Transverse colon. Normal  Hepatic flexure. Normal  Ascending colon. Normal  Cecum and ileocecal valve. Normal.  Assessment: Normal screening colonoscopy  Recommendation: Schedule repeat screening colonoscopy in 10 years

## 2013-10-14 NOTE — Transfer of Care (Signed)
Immediate Anesthesia Transfer of Care Note  Patient: Kaitlyn Tate  Procedure(s) Performed: Procedure(s): COLONOSCOPY WITH PROPOFOL (N/A)  Patient Location: PACU  Anesthesia Type:MAC  Level of Consciousness: awake, sedated and patient cooperative  Airway & Oxygen Therapy: Patient Spontanous Breathing and Patient connected to face mask oxygen  Post-op Assessment: Report given to PACU RN and Post -op Vital signs reviewed and stable  Post vital signs: Reviewed and stable  Complications: No apparent anesthesia complications

## 2013-10-14 NOTE — H&P (Signed)
  Procedure: Screening colonoscopy. Normal screening colonoscopy performed on 05/30/2008. Sister diagnosed with appendiceal cancer under the age of 2.  History: The patient is a 58 year old female born 1956-01-12. She is scheduled to undergo a repeat screening colonoscopy with polypectomy to prevent colon cancer.  Medication allergies: None  Past medical and surgical history: Lipoma removed laparoscopically. Bunionectomy. Endometriosis. Contact dermatitis. Asthma. Anxiety  Exam: The patient is alert and lying comfortably on the endoscopy stretcher. Abdomen is soft and nontender to palpation. Lungs are clear to auscultation. Cardiac exam reveals a regular  Plan: Proceed with screening colonoscopy

## 2013-10-15 ENCOUNTER — Encounter (HOSPITAL_COMMUNITY): Payer: Self-pay | Admitting: Gastroenterology

## 2014-07-17 ENCOUNTER — Encounter: Payer: Self-pay | Admitting: Gynecology

## 2014-10-31 ENCOUNTER — Ambulatory Visit (INDEPENDENT_AMBULATORY_CARE_PROVIDER_SITE_OTHER): Payer: BLUE CROSS/BLUE SHIELD | Admitting: Gynecology

## 2014-10-31 ENCOUNTER — Encounter: Payer: Self-pay | Admitting: Gynecology

## 2014-10-31 ENCOUNTER — Other Ambulatory Visit (HOSPITAL_COMMUNITY)
Admission: RE | Admit: 2014-10-31 | Discharge: 2014-10-31 | Disposition: A | Discharge status: Home / Self Care | Payer: BLUE CROSS/BLUE SHIELD | Source: Ambulatory Visit | Attending: Gynecology | Admitting: Gynecology

## 2014-10-31 VITALS — BP 136/80 | Ht 64.5 in | Wt 146.0 lb

## 2014-10-31 DIAGNOSIS — Z01419 Encounter for gynecological examination (general) (routine) without abnormal findings: Secondary | ICD-10-CM | POA: Diagnosis not present

## 2014-10-31 DIAGNOSIS — M8580 Other specified disorders of bone density and structure, unspecified site: Secondary | ICD-10-CM | POA: Diagnosis not present

## 2014-10-31 NOTE — Progress Notes (Signed)
Kaitlyn Tate October 19, 1955 [id number]        59 y.o.  G0P0 for annual exam.  Doing well without complaints  Past medical history,surgical history, problem list, medications, allergies, family history and social history were all reviewed and documented as reviewed in the EPIC chart.  ROS:  Performed with pertinent positives and negatives included in the history, assessment and plan.   Additional significant findings :  none   Exam: Tim Counsellor Vitals:   10/31/14 0854  BP: 136/80  Height: 5' 4.5" (1.638 m)  Weight: 146 lb (66.225 kg)   General appearance:  Normal affect, orientation and appearance. Skin: Grossly normal HEENT: Without gross lesions.  No cervical or supraclavicular adenopathy. Thyroid normal.  Lungs:  Clear without wheezing, rales or rhonchi Cardiac: RR, without RMG Abdominal:  Soft, nontender, without masses, guarding, rebound, organomegaly or hernia Breasts:  Examined lying and sitting without masses, retractions, discharge or axillary adenopathy. Pelvic:  Ext/BUS/vagina with atrophic changes  Cervix with atrophic changes. Pap smear done  Uterus anteverted, normal size, shape and contour, midline and mobile nontender   Adnexa  Without masses or tenderness    Anus and perineum  Normal   Rectovaginal  Normal sphincter tone without palpated masses or tenderness.    Assessment/Plan:  59 y.o. G0P0 female for annual exam.   1. Postmenopausal/atrophic genital changes. Notes that her hot flashes/night sweats are getting much better. Not having any issues with vaginal dryness. No vaginal bleeding. Continue monitoring report any issues or bleeding. 2. Osteopenia. DEXA 2013 T score -1.3 FRAX 4.7%/0.1%. Plans to have repeated at 5 year interval. Has blood work scheduled with her primary physician and will check a vitamin D with that. 3. Mammography 06/2014. Continue with annual mammography. SBE reviewed. 4. Pap smear 2014. Pap smear repeated today noting no  endocervical cells on last Pap smear. No history of abnormal Pap smears previously. 5. Colonoscopy 2015. Repeat at their recommended interval. 6. Health maintenance. No routine blood work done as patient has this done at her primary physician's office. Follow up in one year, sooner as needed   [name], 9:30 AM 10/31/2014

## 2014-10-31 NOTE — Patient Instructions (Signed)

## 2014-10-31 NOTE — Addendum Note (Signed)
Addended by: Nelva Nay on: 10/31/2014 10:18 AM   Modules accepted: Orders

## 2014-11-01 LAB — URINALYSIS W MICROSCOPIC + REFLEX CULTURE
Bacteria, UA: NONE SEEN [HPF]
Bilirubin Urine: NEGATIVE
Casts: NONE SEEN [LPF]
Crystals: NONE SEEN [HPF]
Glucose, UA: NEGATIVE
Hgb urine dipstick: NEGATIVE
KETONES UR: NEGATIVE
LEUKOCYTES UA: NEGATIVE
NITRITE: NEGATIVE
PH: 6 (ref 5.0–8.0)
Protein, ur: NEGATIVE
RBC / HPF: NONE SEEN RBC/HPF (ref <=2)
SPECIFIC GRAVITY, URINE: 1.011 (ref 1.001–1.035)
Squamous Epithelial / LPF: NONE SEEN [HPF] (ref <=5)
WBC UA: NONE SEEN WBC/HPF (ref <=5)
Yeast: NONE SEEN [HPF]

## 2014-11-03 LAB — CYTOLOGY - PAP

## 2014-11-17 ENCOUNTER — Encounter: Payer: Self-pay | Admitting: Gynecology

## 2015-06-29 ENCOUNTER — Other Ambulatory Visit: Payer: Self-pay | Admitting: Ophthalmology

## 2015-11-02 ENCOUNTER — Encounter: Payer: BLUE CROSS/BLUE SHIELD | Admitting: Gynecology

## 2015-11-20 ENCOUNTER — Encounter: Payer: BLUE CROSS/BLUE SHIELD | Admitting: Gynecology

## 2015-12-17 ENCOUNTER — Ambulatory Visit (INDEPENDENT_AMBULATORY_CARE_PROVIDER_SITE_OTHER): Payer: BLUE CROSS/BLUE SHIELD | Admitting: Gynecology

## 2015-12-17 ENCOUNTER — Encounter: Payer: Self-pay | Admitting: Gynecology

## 2015-12-17 VITALS — BP 116/74 | Ht 64.5 in | Wt 146.0 lb

## 2015-12-17 DIAGNOSIS — M8580 Other specified disorders of bone density and structure, unspecified site: Secondary | ICD-10-CM | POA: Diagnosis not present

## 2015-12-17 DIAGNOSIS — Z01411 Encounter for gynecological examination (general) (routine) with abnormal findings: Secondary | ICD-10-CM

## 2015-12-17 DIAGNOSIS — N760 Acute vaginitis: Secondary | ICD-10-CM | POA: Diagnosis not present

## 2015-12-17 DIAGNOSIS — K640 First degree hemorrhoids: Secondary | ICD-10-CM | POA: Diagnosis not present

## 2015-12-17 DIAGNOSIS — N952 Postmenopausal atrophic vaginitis: Secondary | ICD-10-CM

## 2015-12-17 MED ORDER — FLUCONAZOLE 150 MG PO TABS: 150.0000 mg | ORAL_TABLET | Freq: Once | ORAL | 0 refills | Status: AC

## 2015-12-17 NOTE — Patient Instructions (Signed)

## 2015-12-17 NOTE — Progress Notes (Signed)
    Kaitlyn Tate 1955/04/18 [id number]        60 y.o.  G0P0  for annual exam.    Past medical history,surgical history, problem list, medications, allergies, family history and social history were all reviewed and documented as reviewed in the EPIC chart.  ROS:  Performed with pertinent positives and negatives included in the history, assessment and plan.   Additional significant findings :  None   Exam: Caryn Bee assistant Vitals:   12/17/15 1056  BP: 116/74  Weight: 146 lb (66.2 kg)  Height: 5' 4.5" (1.638 m)   Body mass index is 24.67 kg/m.  General appearance:  Normal affect, orientation and appearance. Skin: Grossly normal HEENT: Without gross lesions.  No cervical or supraclavicular adenopathy. Thyroid normal.  Lungs:  Clear without wheezing, rales or rhonchi Cardiac: RR, without RMG Abdominal:  Soft, nontender, without masses, guarding, rebound, organomegaly or hernia Breasts:  Examined lying and sitting without masses, retractions, discharge or axillary adenopathy. Pelvic:  Ext, BUS, Vagina with atrophic changes  Cervix with atrophic changes  Uterus anteverted, normal size, shape and contour, midline and mobile nontender   Adnexa without masses or tenderness    Anus and perineum normal excepting nonacute hemorrhoids  Rectovaginal normal sphincter tone without palpated masses or tenderness.    Assessment/Plan:  60 y.o. G0P0 female for annual exam.  1. Post menopausal/atrophic genital changes. No significant hot flushes, night sweats, vaginal dryness or any vaginal bleeding. Continue monitor report any issues or bleeding. 2. Vaginal irritation. Patient notes after being treated for antibiotics for diverticulitis she's developed a little bit of vaginal irritation. No real itching or discharge/odor. Exam shows atrophic changes but no significant discharge. Will cover with Diflucan 150 mg 1 dose and follow up if symptoms would persist or worsen. 3. Osteopenia.  DEXA 2013 T score -1.3 FRAX 4.7%/0.1%. Recommend repeat DEXA next year at 5 year interval. Increased calcium vitamin D. 4. Pap smear 2016. No Pap smear done today. No history of significant abnormal Pap smears. 5. Colonoscopy 2015. Repeat at their recommended interval. Is being followed for diverticulitis. 6. Mammography 07/2015. Continue with annual mammography when due. SBE monthly reviewed. 7. Health maintenance. No routine lab work done as patient reports this done at her 54 office. Follow up 1 year, sooner as needed.   [name], 11:12 AM 12/17/2015

## 2016-03-16 ENCOUNTER — Institutional Professional Consult (permissible substitution): Payer: BLUE CROSS/BLUE SHIELD | Admitting: Internal Medicine

## 2016-03-17 ENCOUNTER — Ambulatory Visit (INDEPENDENT_AMBULATORY_CARE_PROVIDER_SITE_OTHER): Payer: BLUE CROSS/BLUE SHIELD | Admitting: Internal Medicine

## 2016-03-17 ENCOUNTER — Encounter: Payer: Self-pay | Admitting: Internal Medicine

## 2016-03-17 VITALS — BP 134/80 | HR 77 | Ht 64.5 in | Wt 144.0 lb

## 2016-03-17 DIAGNOSIS — Z836 Family history of other diseases of the respiratory system: Secondary | ICD-10-CM | POA: Insufficient documentation

## 2016-03-17 DIAGNOSIS — Z8709 Personal history of other diseases of the respiratory system: Secondary | ICD-10-CM

## 2016-03-17 NOTE — Progress Notes (Signed)
Subjective:    Patient ID: Kaitlyn Tate, female    DOB: 04-Aug-1955, 61 y.o.   MRN: [id number]  HPI  OV 03/17/2016   Chief Complaint  Patient presents with  . Advice Only    Referred by Dr. Valora Piccolo for bronchiectasis.    61 year old female. She is my patient Kaitlyn Tate  with bronchiectasis. Patient reports that back in November 2017 she had diverticulitis underwent CT scan of the abdomen at [facility]. Lung cut image is reported to show right lower lobe medial segment bronchiectasis. She is extremely worried about this because his sister is colonized with MRSA bronchiectasis and diffuse bronchiectasis. Patient herself works as a Materials engineer. She says that 16 years ago she suffered from pneumonia not otherwise specified. She was treated as an outpatient. She does not remember the location of the pneumonia in the lung but remembers that it did show Her. She was not admitted for the same. Other than that she's always been healthy. She does not carry any history of asthma or COPD all the COPD as mentioned in the chart. She says she remembers failing of breathing test 16 years ago and she wonders if COPD diagnosis came because of that. She says other than weather-related changes to voice and some condition she does not have any problems. She has never taken prednisone in the last 15 years other than 2 occasions 1 related to the neck and other related to a skin rash. She's not been on frequent antibiotics. She does not have any cough or shortness of breath chest tightness wheezing or sputum production. There is no chest pain.   has a past medical history of COPD (chronic obstructive pulmonary disease) ([place]); Diverticulitis; Endometriosis (07/2006); History of renal stone (7 yrs ago); and Osteopenia (05/2011).   reports that she has never smoked. She has never used smokeless tobacco.  Past Surgical History:  Procedure Laterality Date  . BUNION REMOVED    . CERVICAL DISC SURGERY     x2  AND FUSION IN 01/2009  . COLONOSCOPY WITH PROPOFOL N/A 10/14/2013   Procedure: COLONOSCOPY WITH PROPOFOL;  Surgeon: Garlan Fair, MD;  Location: [facility];  Service: Endoscopy;  Laterality: N/A;  . EYE SURGERY  bi-lat lasik surgery 2006  . PELVIC LAPAROSCOPY  2008   endometriosis  . Tear duct surgery      Allergies  Allergen Reactions  . Amoxicillin-Pot Clavulanate Nausea And Vomiting  . Avelox [Moxifloxacin Hcl In Nacl] Nausea And Vomiting    And diarrhea  . Crab [Shellfish Allergy] Diarrhea    Immunization History  Administered Date(s) Administered  . Influenza Split 10/18/2015  . Pneumococcal Polysaccharide-23 03/18/2015    Family History  Problem Relation Age of Onset  . Hypertension Mother   . Diabetes Mother   . Diverticulitis Mother   . Heart failure Father     CHF  . Cancer Sister     COLON     Current Outpatient Prescriptions:  .  Calcium Carbonate-Vit D-Min 1200-1000 MG-UNIT CHEW, Chew 1 each by mouth every morning. , Disp: , Rfl:  .  ibuprofen (ADVIL,MOTRIN) 200 MG tablet, Take 400 mg by mouth once as needed for moderate pain., Disp: , Rfl:  .  Multiple Vitamin (MULTIVITAMIN) tablet, Take 1 tablet by mouth daily.  , Disp: , Rfl:  .  Omega-3 Fatty Acids (FISH OIL PO), Take 1 capsule by mouth daily. , Disp: , Rfl:  .  oxymetazoline (AFRIN) 0.05 % nasal spray, Place 1 spray  into both nostrils 2 (two) times daily., Disp: , Rfl:  .  Probiotic Product (PROBIOTIC FORMULA PO), Take 1 capsule by mouth every morning. , Disp: , Rfl:  .  pseudoephedrine-guaifenesin (MUCINEX D) 60-600 MG per tablet, Take 1 tablet by mouth 2 (two) times daily as needed for congestion., Disp: , Rfl:  .  sodium chloride (OCEAN) 0.65 % SOLN nasal spray, Place 1 spray into both nostrils once as needed for congestion., Disp: , Rfl:     Review of Systems  Constitutional: Negative for fever and unexpected weight change.  HENT: Positive for congestion, sinus pressure and sneezing. Negative  for dental problem, ear pain, nosebleeds, postnasal drip, rhinorrhea, sore throat and trouble swallowing.   Eyes: Negative for redness and itching.  Respiratory: Negative for cough, chest tightness, shortness of breath and wheezing.   Cardiovascular: Negative for palpitations and leg swelling.  Gastrointestinal: Negative for nausea and vomiting.  Genitourinary: Negative for dysuria.  Musculoskeletal: Negative for joint swelling.  Skin: Negative for rash.  Neurological: Negative for headaches.  Hematological: Does not bruise/bleed easily.  Psychiatric/Behavioral: Negative for dysphoric mood. The patient is not nervous/anxious.      has a past medical history of COPD (chronic obstructive pulmonary disease) ([place]); Diverticulitis; Endometriosis (07/2006); History of renal stone (7 yrs ago); and Osteopenia (05/2011).   reports that she has never smoked. She has never used smokeless tobacco.  Past Surgical History:  Procedure Laterality Date  . BUNION REMOVED    . CERVICAL DISC SURGERY     x2 AND FUSION IN 01/2009  . COLONOSCOPY WITH PROPOFOL N/A 10/14/2013   Procedure: COLONOSCOPY WITH PROPOFOL;  Surgeon: Garlan Fair, MD;  Location: [facility];  Service: Endoscopy;  Laterality: N/A;  . EYE SURGERY  bi-lat lasik surgery 2006  . PELVIC LAPAROSCOPY  2008   endometriosis  . Tear duct surgery      Allergies  Allergen Reactions  . Amoxicillin-Pot Clavulanate Nausea And Vomiting  . Avelox [Moxifloxacin Hcl In Nacl] Nausea And Vomiting    And diarrhea  . Crab [Shellfish Allergy] Diarrhea    Immunization History  Administered Date(s) Administered  . Influenza Split 10/18/2015  . Pneumococcal Polysaccharide-23 03/18/2015    Family History  Problem Relation Age of Onset  . Hypertension Mother   . Diabetes Mother   . Diverticulitis Mother   . Heart failure Father     CHF  . Cancer Sister     COLON     Current Outpatient Prescriptions:  .  Calcium Carbonate-Vit D-Min  1200-1000 MG-UNIT CHEW, Chew 1 each by mouth every morning. , Disp: , Rfl:  .  ibuprofen (ADVIL,MOTRIN) 200 MG tablet, Take 400 mg by mouth once as needed for moderate pain., Disp: , Rfl:  .  Multiple Vitamin (MULTIVITAMIN) tablet, Take 1 tablet by mouth daily.  , Disp: , Rfl:  .  Omega-3 Fatty Acids (FISH OIL PO), Take 1 capsule by mouth daily. , Disp: , Rfl:  .  oxymetazoline (AFRIN) 0.05 % nasal spray, Place 1 spray into both nostrils 2 (two) times daily., Disp: , Rfl:  .  Probiotic Product (PROBIOTIC FORMULA PO), Take 1 capsule by mouth every morning. , Disp: , Rfl:  .  pseudoephedrine-guaifenesin (MUCINEX D) 60-600 MG per tablet, Take 1 tablet by mouth 2 (two) times daily as needed for congestion., Disp: , Rfl:  .  sodium chloride (OCEAN) 0.65 % SOLN nasal spray, Place 1 spray into both nostrils once as needed for congestion., Disp: , Rfl:  Objective:   Physical Exam  Constitutional: She is oriented to person, place, and time. She appears well-developed and well-nourished. No distress.  HENT:  Head: Normocephalic and atraumatic.  Right Ear: External ear normal.  Left Ear: External ear normal.  Mouth/Throat: Oropharynx is clear and moist. No oropharyngeal exudate.  Eyes: Conjunctivae and EOM are normal. Pupils are equal, round, and reactive to light. Right eye exhibits no discharge. Left eye exhibits no discharge. No scleral icterus.  Neck: Normal range of motion. Neck supple. No JVD present. No tracheal deviation present. No thyromegaly present.  Cardiovascular: Normal rate, regular rhythm, normal heart sounds and intact distal pulses.  Exam reveals no gallop and no friction rub.   No murmur heard. Pulmonary/Chest: Effort normal. No respiratory distress. She has no wheezes. She has rales. She exhibits no tenderness.  RLL crackles +  Abdominal: Soft. Bowel sounds are normal. She exhibits no distension and no mass. There is no tenderness. There is no rebound and no guarding.    Musculoskeletal: Normal range of motion. She exhibits no edema or tenderness.  Lymphadenopathy:    She has no cervical adenopathy.  Neurological: She is alert and oriented to person, place, and time. She has normal reflexes. No cranial nerve deficit. She exhibits normal muscle tone. Coordination normal.  Skin: Skin is warm and dry. No rash noted. She is not diaphoretic. No erythema. No pallor.  Psychiatric: She has a normal mood and affect. Her behavior is normal. Judgment and thought content normal.  Vitals reviewed.   Vitals:   03/17/16 0859  BP: 134/80  Pulse: 77  SpO2: 98%  Weight: 144 lb (65.3 kg)  Height: 5' 4.5" (1.638 m)         Assessment & Plan:   Family history of bronchiectasis RLL bronchiectasis per report in nov 2017 CT chest Glad you are fairly asymptomatic Most likely situation is that the pneumonia from 16 years earlier left you with this bronchiectasis  Plan - ok for eye surgery  - To define things better esp with family history of same  - do PFT test - can do at anylocation including [facility] you want  - [facility]   Followup - next 6-8 weeks but after completing above; to decide further course    Dr. Brand Males, M.D., [facility].C.P Pulmonary and Critical Care Medicine Staff Physician [place] [facility] Pager: [phone], If no answer or between  15:00h - 7:00h: call [phone]  03/17/2016 9:25 AM

## 2016-03-17 NOTE — Assessment & Plan Note (Addendum)
RLL bronchiectasis per report in nov 2017 CT chest Glad you are fairly asymptomatic Most likely situation is that the pneumonia from 16 years earlier left you with this bronchiectasis  Plan - ok for eye surgery  - To define things better esp with family history of same  - do PFT test - can do at anylocation including [facility] want  - do [facility] [place]  Followup - next 6-8 weeks but after completing above; to decide further course

## 2016-03-17 NOTE — Patient Instructions (Addendum)
Family history of bronchiectasis RLL bronchiectasis per report in nov 2017 CT chest Glad you are fairly asymptomatic Most likely situation is that the pneumonia from 16 years earlier left you with this bronchiectasis  Plan  - To define things better esp with family history of same  - do PFT test - can do at anylocation including [facility] you want  - do [facility] [place]  Followup - next 6-8 weeks but after completing above; to decide further course

## 2016-03-21 ENCOUNTER — Other Ambulatory Visit: Payer: Self-pay | Admitting: Ophthalmology

## 2016-03-22 ENCOUNTER — Other Ambulatory Visit (HOSPITAL_BASED_OUTPATIENT_CLINIC_OR_DEPARTMENT_OTHER): Payer: BLUE CROSS/BLUE SHIELD

## 2016-04-12 ENCOUNTER — Ambulatory Visit (HOSPITAL_BASED_OUTPATIENT_CLINIC_OR_DEPARTMENT_OTHER)
Admission: RE | Admit: 2016-04-12 | Discharge: 2016-04-12 | Disposition: A | Discharge status: Home / Self Care | Payer: BLUE CROSS/BLUE SHIELD | Source: Ambulatory Visit | Attending: Internal Medicine | Admitting: Internal Medicine

## 2016-04-12 DIAGNOSIS — R911 Solitary pulmonary nodule: Secondary | ICD-10-CM | POA: Diagnosis not present

## 2016-04-12 DIAGNOSIS — Z836 Family history of other diseases of the respiratory system: Secondary | ICD-10-CM | POA: Insufficient documentation

## 2016-04-12 DIAGNOSIS — Z8709 Personal history of other diseases of the respiratory system: Secondary | ICD-10-CM | POA: Diagnosis not present

## 2016-04-12 DIAGNOSIS — J479 Bronchiectasis, uncomplicated: Secondary | ICD-10-CM | POA: Diagnosis not present

## 2016-04-12 DIAGNOSIS — I2510 Atherosclerotic heart disease of native coronary artery without angina pectoris: Secondary | ICD-10-CM | POA: Diagnosis not present

## 2016-04-28 ENCOUNTER — Telehealth: Payer: Self-pay

## 2016-04-28 NOTE — Telephone Encounter (Signed)
Created in error

## 2016-05-17 ENCOUNTER — Ambulatory Visit (INDEPENDENT_AMBULATORY_CARE_PROVIDER_SITE_OTHER): Payer: BLUE CROSS/BLUE SHIELD | Admitting: Internal Medicine

## 2016-05-17 ENCOUNTER — Encounter: Payer: Self-pay | Admitting: Internal Medicine

## 2016-05-17 DIAGNOSIS — Z836 Family history of other diseases of the respiratory system: Secondary | ICD-10-CM | POA: Diagnosis not present

## 2016-05-17 DIAGNOSIS — J479 Bronchiectasis, uncomplicated: Secondary | ICD-10-CM | POA: Diagnosis not present

## 2016-05-17 DIAGNOSIS — R911 Solitary pulmonary nodule: Secondary | ICD-10-CM | POA: Diagnosis not present

## 2016-05-17 DIAGNOSIS — Z8709 Personal history of other diseases of the respiratory system: Secondary | ICD-10-CM

## 2016-05-17 LAB — PULMONARY FUNCTION TEST
DL/VA % pred: 95 %
DL/VA: 4.72 ml/min/mmHg/L
DLCO UNC: 23.87 ml/min/mmHg
DLCO cor % pred: 95 %
DLCO cor: 24.41 ml/min/mmHg
DLCO unc % pred: 93 %
FEF 25-75 Post: 2.12 L/sec
FEF 25-75 Pre: 1.85 L/sec
FEF2575-%Change-Post: 14 %
FEF2575-%Pred-Post: 85 %
FEF2575-%Pred-Pre: 74 %
FEV1-%CHANGE-POST: 1 %
FEV1-%PRED-PRE: 97 %
FEV1-%Pred-Post: 99 %
FEV1-POST: 2.67 L
FEV1-PRE: 2.63 L
FEV1FVC-%CHANGE-POST: 4 %
FEV1FVC-%Pred-Pre: 94 %
FEV6-%Change-Post: -1 %
FEV6-%PRED-PRE: 105 %
FEV6-%Pred-Post: 103 %
FEV6-Post: 3.47 L
FEV6-Pre: 3.52 L
FEV6FVC-%CHANGE-POST: 1 %
FEV6FVC-%PRED-POST: 103 %
FEV6FVC-%Pred-Pre: 101 %
FVC-%CHANGE-POST: -2 %
FVC-%PRED-PRE: 102 %
FVC-%Pred-Post: 99 %
FVC-PRE: 3.57 L
FVC-Post: 3.47 L
POST FEV1/FVC RATIO: 77 %
POST FEV6/FVC RATIO: 100 %
Pre FEV1/FVC ratio: 74 %
Pre FEV6/FVC Ratio: 99 %
RV % PRED: 100 %
RV: 2.02 L
TLC % PRED: 108 %
TLC: 5.62 L

## 2016-05-17 NOTE — Addendum Note (Signed)
Addended by: Collier Salina on: 05/17/2016 11:10 AM   Modules accepted: Orders

## 2016-05-17 NOTE — Progress Notes (Signed)
PFT done today. 

## 2016-05-17 NOTE — Assessment & Plan Note (Signed)
19mm right middle lobe nodule on CT march 2018 Unlikely cancer  Plan Repeat ct chest wo contrast 1 year from 05/17/2016

## 2016-05-17 NOTE — Patient Instructions (Signed)
Bronchiectasis without complication (HCC) Mild asymptomatic bronchiectasis right lower lobe due to remote pneumonia Normal lung function  Plan  -observation therapy - will hold off blood work for causes - return here for any acute respiratory issues that might arise - can test for asthma at that time  folllowup 1 year or sooner if needed  Nodule of right lung 9mm right middle lobe nodule on CT march 2018 Unlikely cancer  Plan Repeat ct chest wo contrast 1 year from 05/17/2016

## 2016-05-17 NOTE — Progress Notes (Signed)
Subjective:     Patient ID: Kaitlyn Tate, female   DOB: [birth year], 61 y.o.   MRN: [id number]  HPI    OV 03/17/2016   Chief Complaint  Patient presents with  . Advice Only    Referred by Dr. Valora Piccolo for bronchiectasis.    61 year old female. She is my patient Kaitlyn Tate  with bronchiectasis. Patient reports that back in November 2017 she had diverticulitis underwent CT scan of the abdomen at [facility]. Lung cut image is reported to show right lower lobe medial segment bronchiectasis. She is extremely worried about this because his sister is colonized with MRSA bronchiectasis and diffuse bronchiectasis. Patient herself works as a Materials engineer. She says that 16 years ago she suffered from pneumonia not otherwise specified. She was treated as an outpatient. She does not remember the location of the pneumonia in the lung but remembers that it did show Her. She was not admitted for the same. Other than that she's always been healthy. She does not carry any history of asthma or COPD all the COPD as mentioned in the chart. She says she remembers failing of breathing test 16 years ago and she wonders if COPD diagnosis came because of that. She says other than weather-related changes to voice and some condition she does not have any problems. She has never taken prednisone in the last 15 years other than 2 occasions 1 related to the neck and other related to a skin rash. She's not been on frequent antibiotics. She does not have any cough or shortness of breath chest tightness wheezing or sputum production. There is no chest pain.  OV 05/17/2016  Chief Complaint  Patient presents with  . Follow-up    Pt here after PFT and HRCT. Pt states her breathing is unchanged since last OV. Pt states she has some sinus congestion d/t seasonal allergies. Pt c/o PND and prod cough with light green mucus. Pt denies CP/tightness and f/c/s.    Follow-up for concern of bronchiectasis with a family history of  bronchiectasis. No problems since last visit. Here to do review of pulmonary function tests which is normal. CT chest with documented findings below. No interim respiratory complaints. She tells me that the only episode of wheezing she had was over 2 years ago during a respiratory infection. She feels she's been mislabeled as asthma or COPD. Currently asymptomatic from a respiratory standpoint.     Results for Kaitlyn Tate, Kaitlyn Tate (MRN [id number]) as of 05/17/2016 10:53  Ref. Range 05/17/2016 08:51  FVC-Pre Latest Units: L 3.57  FVC-%Pred-Pre Latest Units: % 102  FEV1-Pre Latest Units: L 2.63  FEV1-%Pred-Pre Latest Units: % 97  Pre FEV1/FVC ratio Latest Units: % 74   Results for Kaitlyn Tate, Kaitlyn Tate (MRN [id number]) as of 05/17/2016 10:53  Ref. Range 05/17/2016 08:51  DLCO cor Latest Units: ml/min/mmHg 24.41  DLCO cor % pred Latest Units: % 95  IMPRESSION: 1. While there is an area of mild cylindrical bronchiectasis and architectural distortion in the right lower lobe, this is strongly favored to be related to scarring from prior infection/inflammation. There are otherwise no more generalized features to suggest underlying interstitial lung disease. 2. Tiny subpleural pulmonary nodule with a mean diameter 4 mm in the superior aspect of the right middle lobe (image 84 of series 5) is statistically likely a benign subpleural lymph node. No follow-up needed if patient is low-risk. Non-contrast chest CT can be considered in 12 months if patient is high-risk. This recommendation follows the  consensus statement: Guidelines for Management of Incidental Pulmonary Nodules Detected on CT Images: From the Fleischner Society 2017; Radiology 2017; 284:228-243. 3. Mild air trapping, indicative of mild small airways disease.   Electronically Signed   By: Vinnie Langton M.D.   On: 04/12/2016 09:46   has a past medical history of COPD (chronic obstructive pulmonary disease) ([place]); Diverticulitis;  Endometriosis (07/2006); History of renal stone (7 yrs ago); and Osteopenia (05/2011).   reports that she has never smoked. She has never used smokeless tobacco.  Past Surgical History:  Procedure Laterality Date  . BUNION REMOVED    . CERVICAL DISC SURGERY     x2 AND FUSION IN 01/2009  . COLONOSCOPY WITH PROPOFOL N/A 10/14/2013   Procedure: COLONOSCOPY WITH PROPOFOL;  Surgeon: Garlan Fair, MD;  Location: [facility];  Service: Endoscopy;  Laterality: N/A;  . EYE SURGERY  bi-lat lasik surgery 2006  . PELVIC LAPAROSCOPY  2008   endometriosis  . Tear duct surgery      Allergies  Allergen Reactions  . Amoxicillin-Pot Clavulanate Nausea And Vomiting  . Avelox [Moxifloxacin Hcl In Nacl] Nausea And Vomiting    And diarrhea  . Crab [Shellfish Allergy] Diarrhea    Immunization History  Administered Date(s) Administered  . DTaP 08/29/2007  . Influenza Split 10/18/2015  . Pneumococcal Polysaccharide-23 11/05/2008, 01/29/2013, 03/18/2015  . Tdap 08/29/2007    Family History  Problem Relation Age of Onset  . Hypertension Mother   . Diabetes Mother   . Diverticulitis Mother   . Heart failure Father     CHF  . Cancer Sister     COLON     Current Outpatient Prescriptions:  .  Calcium Carbonate-Vit D-Min 1200-1000 MG-UNIT CHEW, Chew 1 each by mouth every morning. , Disp: , Rfl:  .  ibuprofen (ADVIL,MOTRIN) 200 MG tablet, Take 400 mg by mouth once as needed for moderate pain., Disp: , Rfl:  .  Multiple Vitamin (MULTIVITAMIN) tablet, Take 1 tablet by mouth daily.  , Disp: , Rfl:  .  Omega-3 Fatty Acids (FISH OIL PO), Take 1 capsule by mouth daily. , Disp: , Rfl:  .  oxymetazoline (AFRIN) 0.05 % nasal spray, Place 1 spray into both nostrils 2 (two) times daily., Disp: , Rfl:  .  Probiotic Product (PROBIOTIC FORMULA PO), Take 1 capsule by mouth every morning. , Disp: , Rfl:  .  pseudoephedrine-guaifenesin (MUCINEX D) 60-600 MG per tablet, Take 1 tablet by mouth 2 (two) times  daily as needed for congestion., Disp: , Rfl:  .  sodium chloride (OCEAN) 0.65 % SOLN nasal spray, Place 1 spray into both nostrils once as needed for congestion., Disp: , Rfl:     Review of Systems     Objective:   Physical Exam Vitals:   05/17/16 1043  BP: 126/86  Pulse: 63  SpO2: 98%  Weight: 144 lb 6.4 oz (65.5 kg)  Height: 5' 4.5" (1.638 m)    Estimated body mass index is 24.4 kg/m as calculated from the following:   Height as of this encounter: 5' 4.5" (1.638 m).   Weight as of this encounter: 144 lb 6.4 oz (65.5 kg). Discussion only visit    Assessment:       ICD-9-CM ICD-10-CM   1. Bronchiectasis without complication (HCC) 627.0 J47.9   2. Nodule of right lung 793.11 R91.1        Plan:     Bronchiectasis without complication (HCC) Mild asymptomatic bronchiectasis right lower lobe due to remote  pneumonia Normal lung function  Plan  -observation therapy - will hold off blood work for causes - return here for any acute respiratory issues that might arise - can test for asthma at that time  folllowup 1 year or sooner if needed  Nodule of right lung 80mm right middle lobe nodule on CT march 2018 Unlikely cancer  Plan Repeat ct chest wo contrast 1 year from 05/17/2016   (> 50% of this 15 min visit spent in face to face counseling or/and coordination of care)   Dr. Brand Males, M.D., [facility].C.P Pulmonary and Critical Care Medicine Staff Physician [place] [facility] Pager: [phone], If no answer or between  15:00h - 7:00h: call [phone]  05/17/2016 11:07 AM

## 2016-05-17 NOTE — Assessment & Plan Note (Addendum)
Mild asymptomatic bronchiectasis right lower lobe due to remote pneumonia Normal lung function  Plan  -observation therapy - will hold off blood work for causes - return here for any acute respiratory issues that might arise - can test for asthma at that time  folllowup 1 year or sooner if needed

## 2016-05-18 ENCOUNTER — Telehealth: Payer: Self-pay | Admitting: Internal Medicine

## 2016-05-23 NOTE — Telephone Encounter (Signed)
MR - this phone encounter was opened by you on 5.2.2018, was this opened by mistake? Can I close it?  Thanks

## 2016-05-23 NOTE — Telephone Encounter (Signed)
Yeah close it

## 2016-07-11 ENCOUNTER — Other Ambulatory Visit: Payer: Self-pay | Admitting: Ophthalmology

## 2016-12-22 ENCOUNTER — Ambulatory Visit (INDEPENDENT_AMBULATORY_CARE_PROVIDER_SITE_OTHER): Payer: BLUE CROSS/BLUE SHIELD | Admitting: Gynecology

## 2016-12-22 ENCOUNTER — Encounter: Payer: Self-pay | Admitting: Gynecology

## 2016-12-22 VITALS — BP 118/76 | Ht 64.0 in | Wt 147.0 lb

## 2016-12-22 DIAGNOSIS — Z01411 Encounter for gynecological examination (general) (routine) with abnormal findings: Secondary | ICD-10-CM

## 2016-12-22 DIAGNOSIS — N952 Postmenopausal atrophic vaginitis: Secondary | ICD-10-CM | POA: Diagnosis not present

## 2016-12-22 DIAGNOSIS — M8580 Other specified disorders of bone density and structure, unspecified site: Secondary | ICD-10-CM

## 2016-12-22 NOTE — Patient Instructions (Addendum)
Schedule your bone density with your next mammogram.  Follow-up in 1 year for annual exam

## 2016-12-22 NOTE — Addendum Note (Signed)
Addended by: Nelva Nay on: 12/22/2016 09:26 AM   Modules accepted: Orders

## 2016-12-22 NOTE — Progress Notes (Signed)
    Kaitlyn Tate 1955/02/14 [id number]        61 y.o.  G0P0 for annual gynecologic exam.  Doing well without gynecologic complaints  Past medical history,surgical history, problem list, medications, allergies, family history and social history were all reviewed and documented as reviewed in the EPIC chart.  ROS:  Performed with pertinent positives and negatives included in the history, assessment and plan.   Additional significant findings : None   Exam: Caryn Bee assistant Vitals:   12/22/16 0900  BP: 118/76  Weight: 147 lb (66.7 kg)  Height: 5\' 4"  (1.626 m)   Body mass index is 25.23 kg/m.  General appearance:  Normal affect, orientation and appearance. Skin: Grossly normal HEENT: Without gross lesions.  No cervical or supraclavicular adenopathy. Thyroid normal.  Lungs:  Clear without wheezing, rales or rhonchi Cardiac: RR, without RMG Abdominal:  Soft, nontender, without masses, guarding, rebound, organomegaly or hernia Breasts:  Examined lying and sitting without masses, retractions, discharge or axillary adenopathy. Pelvic:  Ext, BUS, Vagina: With atrophic changes  Cervix: With atrophic changes.  Pap smear done  Uterus: Anteverted, normal size, shape and contour, midline and mobile nontender   Adnexa: Without masses or tenderness    Anus and perineum: Normal   Rectovaginal: Normal sphincter tone without palpated masses or tenderness.    Assessment/Plan:  61 y.o. G0P0 female for annual gynecologic exam.   1. Postmenopausal/atrophic genital changes.  No significant hot flushes, night sweats, vaginal dryness or any vaginal bleeding.  Continue to monitor and report any issues or bleeding. 2. Osteopenia.  DEXA 2013 T score -1.3 FRAX 4.7% / 0.1%.  Patient is going to schedule DEXA this year when she does her mammogram at a 5-year interval. 3. Mammography 05/2016.  Continue with annual mammography when due.  Breast exam normal today. 4. Pap smear 2016.  Pap smear  done today.  Patient uncomfortable with waiting longer to repeat her Pap smear.  No history of abnormal Pap smears. 5. Colonoscopy 2015.  Repeat at their recommended interval. 6. Health maintenance.  No routine lab work done as patient does this elsewhere.  Follow-up 1 year, sooner as needed.   [name], 9:22 AM 12/22/2016

## 2016-12-26 LAB — PAP IG W/ RFLX HPV ASCU

## 2017-01-17 HISTORY — PX: LIPOMA EXCISION: SHX5283

## 2017-03-21 ENCOUNTER — Telehealth: Payer: Self-pay | Admitting: Internal Medicine

## 2017-03-21 NOTE — Telephone Encounter (Signed)
Ct scheduled for 05/31/17 at [facility] pt aware of the appt

## 2017-05-31 ENCOUNTER — Telehealth: Payer: Self-pay | Admitting: Internal Medicine

## 2017-05-31 ENCOUNTER — Ambulatory Visit (HOSPITAL_BASED_OUTPATIENT_CLINIC_OR_DEPARTMENT_OTHER)
Admission: RE | Admit: 2017-05-31 | Discharge: 2017-05-31 | Disposition: A | Discharge status: Home / Self Care | Payer: BLUE CROSS/BLUE SHIELD | Source: Ambulatory Visit | Attending: Internal Medicine | Admitting: Internal Medicine

## 2017-05-31 DIAGNOSIS — R911 Solitary pulmonary nodule: Secondary | ICD-10-CM | POA: Insufficient documentation

## 2017-05-31 DIAGNOSIS — L089 Local infection of the skin and subcutaneous tissue, unspecified: Secondary | ICD-10-CM | POA: Insufficient documentation

## 2017-05-31 NOTE — Telephone Encounter (Signed)
Let patien tknow  1. Nodule stable and will d/w her on office visit 06/09/17  2. Regarding new finding on back  - review chart shows 05/29/17 she saw someone at [facility] for this and this is some infectin folliowing lipoma exicision. She should call that [facility] doc and have them review our report to see if anything different needs to be done   Thanks  Dr. Brand Males, M.D., [facility].C.P Pulmonary and Critical Care Medicine Staff Physician, [place] Director - Interstitial Lung Disease  Program  Pulmonary Standard at [place], [place], [address]  Pager: [phone], If no answer or between  15:00h - 7:00h: call [phone] Telephone: [phone]    Ct Chest Wo Contrast  Result Date: 05/31/2017 CLINICAL DATA:  Follow-up lung nodules.  Nonsmoker EXAM: CT CHEST WITHOUT CONTRAST TECHNIQUE: Multidetector CT imaging of the chest was performed following the standard protocol without IV contrast. COMPARISON:  CT 04/12/2016 FINDINGS: Cardiovascular: No significant vascular findings. Normal heart size. No pericardial effusion. Mediastinum/Nodes: No axillary or supraclavicular adenopathy. No mediastinal hilar adenopathy. Small 8 mm nodule in the LEFT lobe of thyroid gland similar Lungs/Pleura: 7 mm nodule along the RIGHT horizontal fissure (image 87/3) is not changed from 7 mm on prior remeasured. Visually lesion appears unchanged. Small focus atelectasis and bronchiectasis in the medial RIGHT lower lobe is unchanged. No new pulmonary nodules are present. Upper Abdomen: Limited view of the liver, kidneys, pancreas are unremarkable. Normal adrenal glands. Musculoskeletal: No aggressive osseous lesion. There is a subcutaneous inflammatory/infectious process in the central back superficial to the RIGHT paraspinal musculature at the at the T8/T9 vertebral body level. There skin eruption/blistering adjacent to the collection. The  collection has small gas locules (image 111/2) and the subcutaneous portion measures 3.2 by 1.5 cm IMPRESSION: 1. Cutaneous infection in the central RIGHT back with skin changes and subcutaneous gas locules. Recommend clinical correlation for gas-forming infection. 2. Stable pulmonary nodule along the RIGHT horizontal fissure. This is typical location and appearance of benign intrafissural lymph nodes. These results will be called to the ordering clinician or representative by the Radiologist Assistant, and communication documented in the PACS or zVision Dashboard. Electronically Signed   By: Suzy Bouchard M.D.   On: 05/31/2017 12:52

## 2017-05-31 NOTE — Telephone Encounter (Signed)
Spoke with Opal Sidles at [facility]. Took a call report on her CT chest.  IMPRESSION: 1. Cutaneous infection in the central RIGHT back with skin changes and subcutaneous gas locules. Recommend clinical correlation for gas-forming infection. 2. Stable pulmonary nodule along the RIGHT horizontal fissure. This is typical location and appearance of benign intrafissural lymph Nodes.  Dr. Chase Caller - please advise. Thanks.

## 2017-05-31 NOTE — Telephone Encounter (Signed)
Pt is aware of below results and voiced her understanding.  Pt states she is scheduled for f/u with [facility] tomorrow and will discuss this matter. Nothing further is needed.

## 2017-06-06 ENCOUNTER — Telehealth: Payer: Self-pay | Admitting: Internal Medicine

## 2017-06-06 NOTE — Telephone Encounter (Signed)
Spoke with patient. She states that she is doing well and that she feels that she doesn't need an appointment now and would appreciate being placed on recall for 1 year.  Appt cancelled and placed on recall in 1 year. Nothing further needed.

## 2017-06-06 NOTE — Telephone Encounter (Signed)
Dr. Chase Caller do you still want the patient to come in for an [place] on 5/24, she states she already received her results and just wanted to make sure. Since her results are stable and doesn't know if it is still neccessary. Please advise.

## 2017-06-06 NOTE — Telephone Encounter (Signed)
If well no need to come. She can come in 1 year so does not run risk of losing status as old established patient

## 2017-06-09 ENCOUNTER — Ambulatory Visit: Payer: BLUE CROSS/BLUE SHIELD | Admitting: Internal Medicine

## 2017-12-26 ENCOUNTER — Ambulatory Visit (INDEPENDENT_AMBULATORY_CARE_PROVIDER_SITE_OTHER): Payer: BLUE CROSS/BLUE SHIELD | Admitting: Gynecology

## 2017-12-26 ENCOUNTER — Encounter: Payer: Self-pay | Admitting: Gynecology

## 2017-12-26 VITALS — BP 130/82 | Ht 64.0 in | Wt 144.4 lb

## 2017-12-26 DIAGNOSIS — N952 Postmenopausal atrophic vaginitis: Secondary | ICD-10-CM | POA: Diagnosis not present

## 2017-12-26 DIAGNOSIS — Z01419 Encounter for gynecological examination (general) (routine) without abnormal findings: Secondary | ICD-10-CM | POA: Diagnosis not present

## 2017-12-26 DIAGNOSIS — M8580 Other specified disorders of bone density and structure, unspecified site: Secondary | ICD-10-CM | POA: Diagnosis not present

## 2017-12-26 NOTE — Patient Instructions (Signed)
Follow-up in 1 year for annual exam, sooner if any issues. 

## 2017-12-26 NOTE — Progress Notes (Signed)
    Kaitlyn Tate Aug 02, 1955 [id number]        62 y.o.  G0P0 for annual gynecologic exam.  Without gynecologic complaints  Past medical history,surgical history, problem list, medications, allergies, family history and social history were all reviewed and documented as reviewed in the EPIC chart.  ROS:  Performed with pertinent positives and negatives included in the history, assessment and plan.   Additional significant findings : None   Exam: Copywriter, advertising Vitals:   12/26/17 1132  BP: 130/82  Weight: 144 lb 6.4 oz (65.5 kg)  Height: 5\' 4"  (1.626 m)   Body mass index is 24.79 kg/m.  General appearance:  Normal affect, orientation and appearance. Skin: Grossly normal HEENT: Without gross lesions.  No cervical or supraclavicular adenopathy. Thyroid normal.  Lungs:  Clear without wheezing, rales or rhonchi Cardiac: RR, without RMG Abdominal:  Soft, nontender, without masses, guarding, rebound, organomegaly or hernia Breasts:  Examined lying and sitting without masses, retractions, discharge or axillary adenopathy. Pelvic:  Ext, BUS, Vagina: With atrophic changes  Cervix: With atrophic changes  Uterus: Anteverted, normal size, shape and contour, midline and mobile nontender   Adnexa: Without masses or tenderness    Anus and perineum: Normal   Rectovaginal: Normal sphincter tone without palpated masses or tenderness.    Assessment/Plan:  62 y.o. G0P0 female for annual gynecologic exam.   1. Postmenopausal.  No significant menopausal symptoms or any vaginal bleeding. 2. Osteopenia.  Reports DEXA 2017 with planned repeat in 2020.  I do not have a copy of these results as they are managed by her primary physician.  She will continue to follow-up with him in reference to bone health. 3. Pap smear 2018.  No Pap smear done today.  No history of abnormal Pap smears previously. 4. Mammography 2019.  Repeat this coming year with her bone density.  Breast exam normal  a. 5. Colonoscopy 2015.  Repeat at their recommended interval. 6. Health maintenance.  No routine lab work done as patient does this elsewhere.  Follow-up 1 year, sooner as needed.   [name], 11:56 AM 12/26/2017

## 2018-01-03 ENCOUNTER — Encounter: Payer: Self-pay | Admitting: Gynecology

## 2018-10-16 ENCOUNTER — Encounter: Payer: Self-pay | Admitting: Gynecology

## 2018-12-27 ENCOUNTER — Other Ambulatory Visit: Payer: Self-pay

## 2018-12-28 ENCOUNTER — Encounter: Payer: Self-pay | Admitting: Gynecology

## 2018-12-28 ENCOUNTER — Encounter: Payer: BLUE CROSS/BLUE SHIELD | Admitting: Gynecology

## 2018-12-28 ENCOUNTER — Telehealth: Payer: Self-pay | Admitting: *Deleted

## 2018-12-28 ENCOUNTER — Ambulatory Visit (INDEPENDENT_AMBULATORY_CARE_PROVIDER_SITE_OTHER): Payer: BC Managed Care – PPO | Admitting: Gynecology

## 2018-12-28 VITALS — BP 120/74 | Ht 64.0 in | Wt 143.0 lb

## 2018-12-28 DIAGNOSIS — M8580 Other specified disorders of bone density and structure, unspecified site: Secondary | ICD-10-CM | POA: Diagnosis not present

## 2018-12-28 DIAGNOSIS — K648 Other hemorrhoids: Secondary | ICD-10-CM | POA: Diagnosis not present

## 2018-12-28 DIAGNOSIS — N952 Postmenopausal atrophic vaginitis: Secondary | ICD-10-CM

## 2018-12-28 DIAGNOSIS — Z01419 Encounter for gynecological examination (general) (routine) without abnormal findings: Secondary | ICD-10-CM

## 2018-12-28 MED ORDER — LIDOCAINE-HYDROCORTISONE ACE 3-2.5 % RE KIT: PACK | RECTAL | 6 refills | Status: DC

## 2018-12-28 NOTE — Telephone Encounter (Signed)
Patient was seen today and prescribed lidocaine-hydrocortisone 3-2.5 mg kit , medication is $300,asked if you could prescribed another Rx? Please advise

## 2018-12-28 NOTE — Telephone Encounter (Signed)
Check with pharmacist for cheaper equivalent recommendation

## 2018-12-28 NOTE — Patient Instructions (Signed)
Try the new hemorrhoid cream to see if it does not help.  Follow-up in 1 year for annual exam

## 2018-12-28 NOTE — Progress Notes (Signed)
    Kaitlyn Tate 09-02-1955 [id number]        63 y.o.  G0P0 for annual gynecologic exam.  Is having issues with her hemorrhoids and asked about a prescription cream to help.  Past medical history,surgical history, problem list, medications, allergies, family history and social history were all reviewed and documented as reviewed in the EPIC chart.  ROS:  Performed with pertinent positives and negatives included in the history, assessment and plan.   Additional significant findings : None   Exam: Kaitlyn Tate assistant Vitals:   12/28/18 1026  BP: 120/74  Weight: 143 lb (64.9 kg)  Height: 5\' 4"  (1.626 m)   Body mass index is 24.55 kg/m.  General appearance:  Normal affect, orientation and appearance. Skin: Grossly normal HEENT: Without gross lesions.  No cervical or supraclavicular adenopathy. Thyroid normal.  Lungs:  Clear without wheezing, rales or rhonchi Cardiac: RR, without RMG Abdominal:  Soft, nontender, without masses, guarding, rebound, organomegaly or hernia Breasts:  Examined lying and sitting without masses, retractions, discharge or axillary adenopathy. Pelvic:  Ext, BUS, Vagina: With atrophic changes  Cervix: With atrophic changes  Uterus: Anteverted, normal size, shape and contour, midline and mobile nontender   Adnexa: Without masses or tenderness    Anus and perineum: Normal noting old external hemorrhoids  Rectovaginal: Normal sphincter tone without palpated masses or tenderness.    Assessment/Plan:  63 y.o. G0P0 female for annual gynecologic exam.   1. Postmenopausal/atrophic genital changes.  No significant menopausal symptoms or any vaginal bleeding. 2. Osteopenia.  Reports recent DEXA 2020.  Is followed elsewhere for this. 3. Mammography 06/2018.  Continue with annual mammography when due.  Breast exam normal today. 4. Colonoscopy 2015.  Repeat at their recommended interval. 5. Pap smear 12/2016.  No Pap smear done today.  No history of abnormal  Pap smears.  Plan repeat Pap smear next year at 3-year interval per current screening guidelines. 6. External hemorrhoids.  Will try [employer] HC equivalent to see if this does not help. 7. Health maintenance.  No routine lab work done as patient does this elsewhere.  Follow-up 1 year, sooner as needed.   [name], 10:51 AM 12/28/2018

## 2018-12-31 MED ORDER — LIDOCAINE-HYDROCORTISONE ACE 2.8-0.55 % RE GEL: RECTAL | 1 refills | Status: DC

## 2018-12-31 NOTE — Telephone Encounter (Signed)
I did check with pharmacist and no other equivalent was listed from the insurance company. Pharmacist was not able to tell me a cheap equivalent, will need to send a new Rx for different medication so Rx can be ran with insurance for coverage.

## 2018-12-31 NOTE — Telephone Encounter (Signed)
Can try the combination that I have marked to be sent

## 2018-12-31 NOTE — Telephone Encounter (Signed)
Rx sent 

## 2019-09-02 IMAGING — CT CT CHEST W/O CM
2 of 3 series · 15 of 36 positions shown, 18 images · non-contrast
Comparison: CT 04/12/2016

CLINICAL DATA: Follow-up lung nodules.  Nonsmoker

EXAM:
CT CHEST WITHOUT CONTRAST
TECHNIQUE: Multidetector CT imaging of the chest was performed following the
standard protocol without IV contrast.

[Series 3: lungs · axial · 0.64mm/px · z∈[-294,-34]mm · 12 of 154 slices shown, 15 images]
[im 12/154  mediastinal]
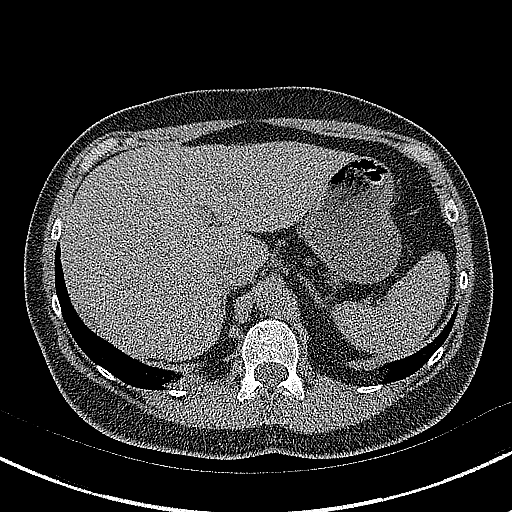
[im 12/154  lung]
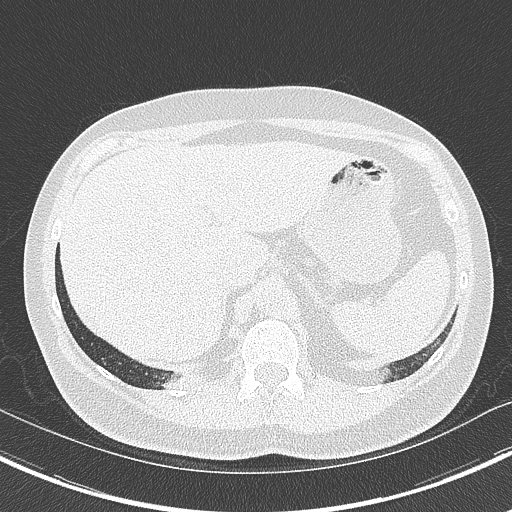
[im 23/154  lung]
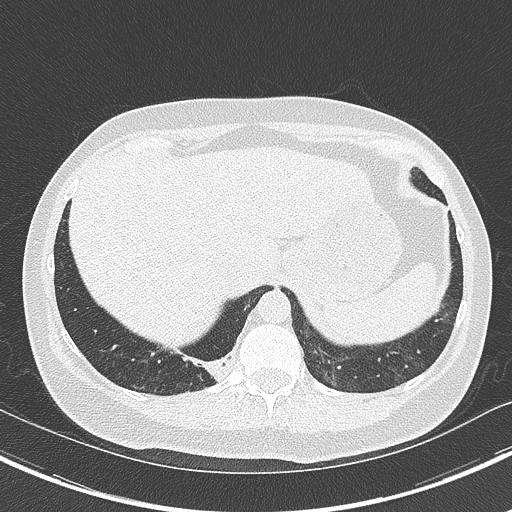
[im 35/154  lung]
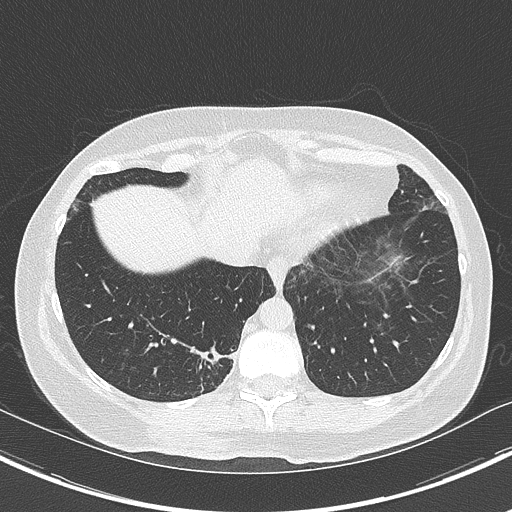
[im 46/154  lung]
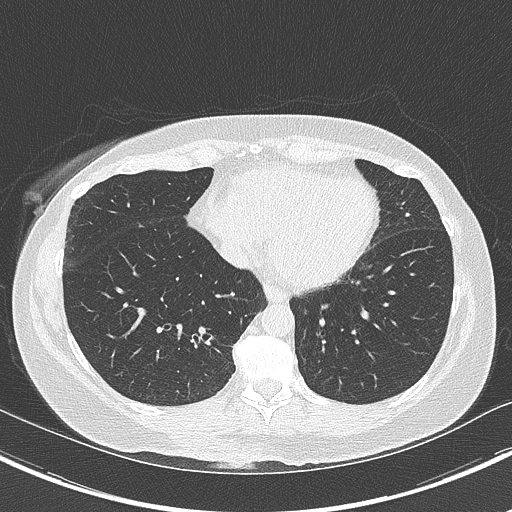
[im 57/154  mediastinal]
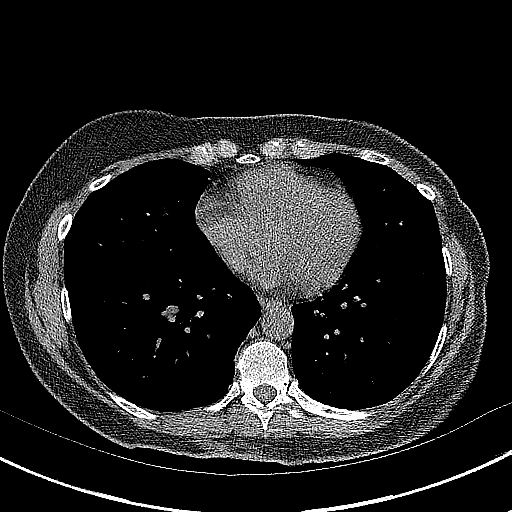
[im 57/154  lung]
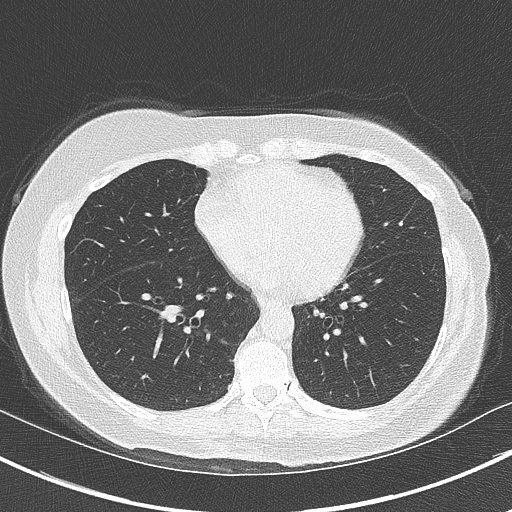
[im 69/154  lung]
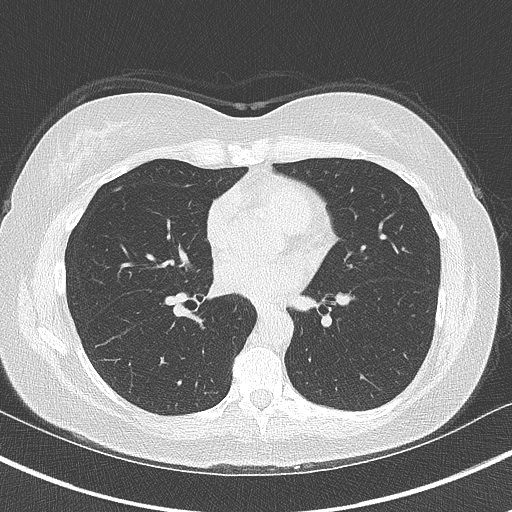
[im 86/154  lung]
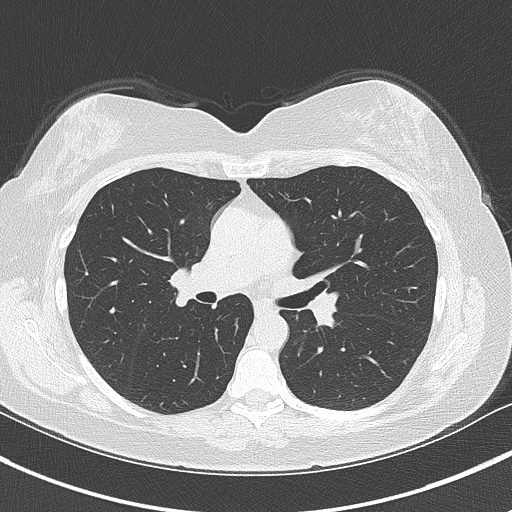
[im 97/154  lung]
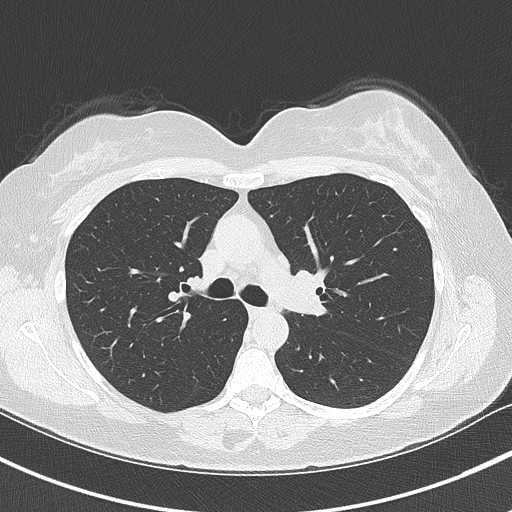
[im 108/154  mediastinal]
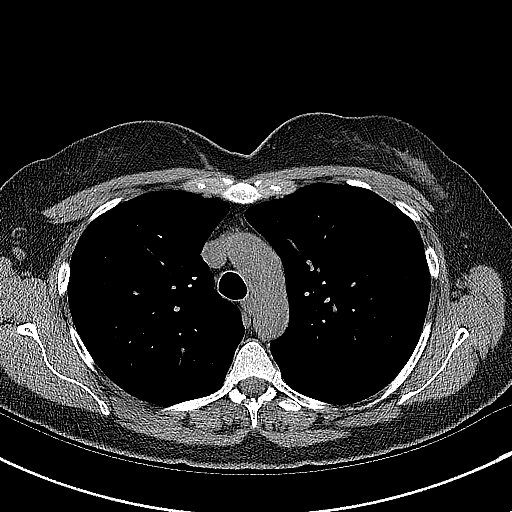
[im 108/154  lung]
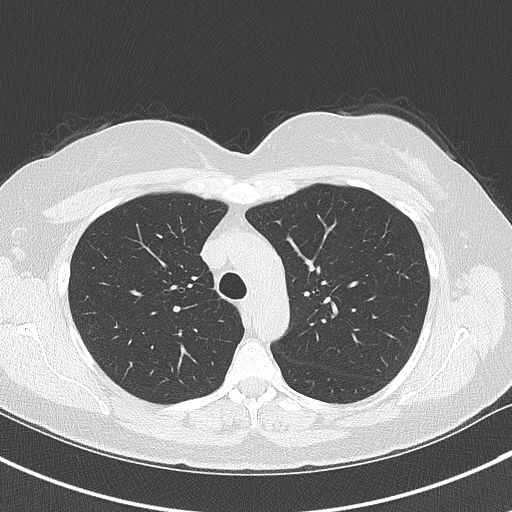
[im 120/154  lung]
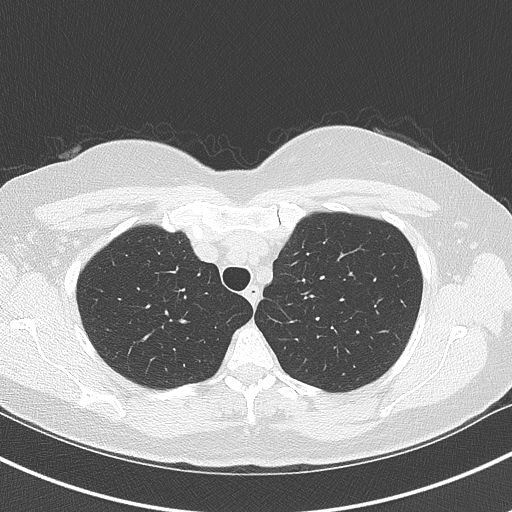
[im 131/154  lung]
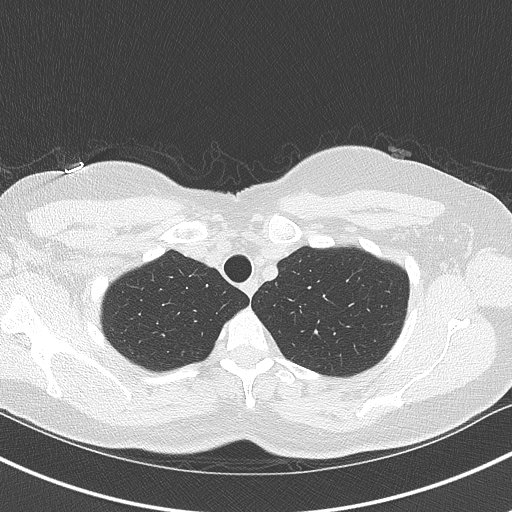
[im 142/154  lung]
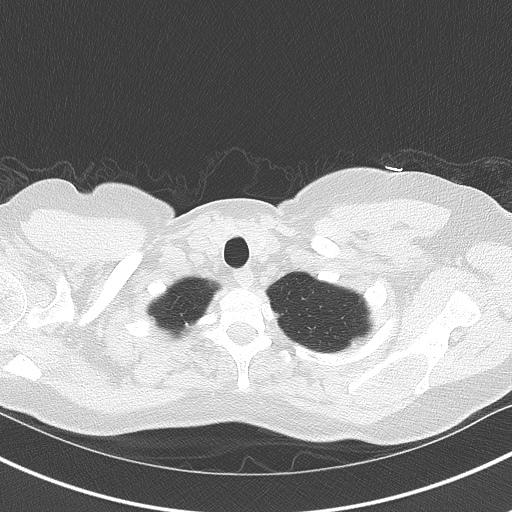

[Series 5: coronal · coronal · 0.62mm/px · 3 of 125 slices shown]
[im 25/125  lung]
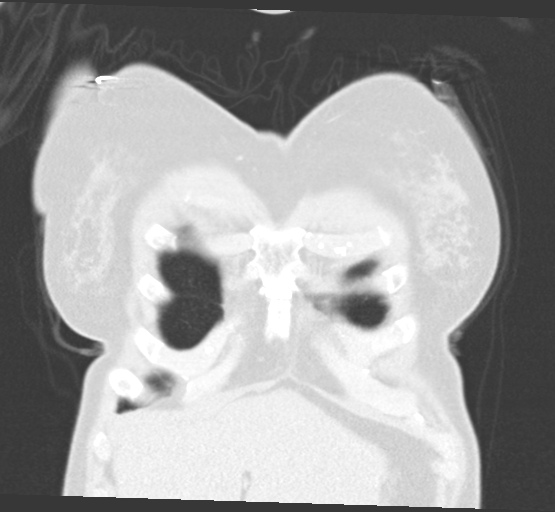
[im 50/125  lung]
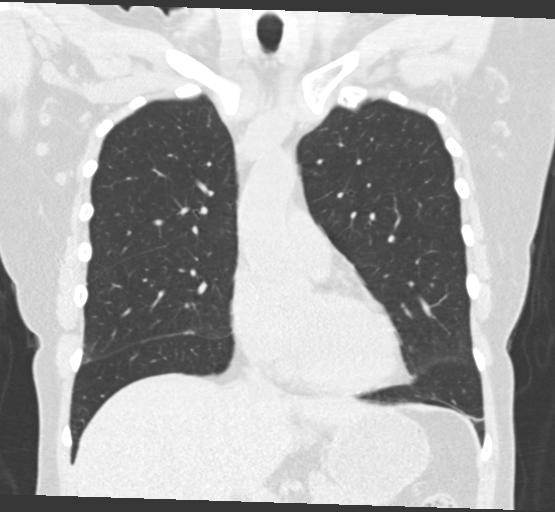
[im 75/125  lung]
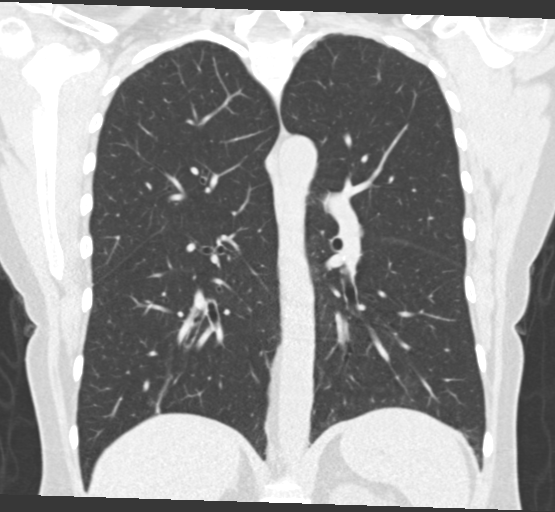

[15 of 36 positions shown; findings below may reference images not displayed]

FINDINGS: Cardiovascular: No significant vascular findings. Normal heart size.
No pericardial effusion.

Mediastinum/Nodes: No axillary or supraclavicular adenopathy. No
mediastinal hilar adenopathy. Small 8 mm nodule in the LEFT lobe of
thyroid gland similar

Lungs/Pleura: 7 mm nodule along the RIGHT horizontal fissure (image
87/3) is not changed from 7 mm on prior remeasured. Visually lesion
appears unchanged.

Small focus atelectasis and bronchiectasis in the medial RIGHT lower
lobe is unchanged. No new pulmonary nodules are present.

Upper Abdomen: Limited view of the liver, kidneys, pancreas are
unremarkable. Normal adrenal glands.

Musculoskeletal: No aggressive osseous lesion.

There is a subcutaneous inflammatory/infectious process in the
central back superficial to the RIGHT paraspinal musculature at the
at the T8/T9 vertebral body level. There skin eruption/blistering
adjacent to the collection. The collection has small gas locules
(image 111/2) and the subcutaneous portion measures 3.2 by 1.5 cm
IMPRESSION: 1. Cutaneous infection in the central RIGHT back with skin changes
and subcutaneous gas locules. Recommend clinical correlation for
gas-forming infection.
2. Stable pulmonary nodule along the RIGHT horizontal fissure. This
is typical location and appearance of benign intrafissural lymph
nodes.

These results will be called to the ordering clinician or
representative by the Radiologist Assistant, and communication
documented in the PACS or zVision Dashboard.

## 2019-12-31 ENCOUNTER — Encounter: Payer: Self-pay | Admitting: Nurse Practitioner

## 2019-12-31 ENCOUNTER — Other Ambulatory Visit: Payer: Self-pay

## 2019-12-31 ENCOUNTER — Ambulatory Visit (INDEPENDENT_AMBULATORY_CARE_PROVIDER_SITE_OTHER): Payer: BC Managed Care – PPO | Admitting: Nurse Practitioner

## 2019-12-31 VITALS — BP 128/80 | Ht 64.0 in | Wt 135.0 lb

## 2019-12-31 DIAGNOSIS — Z01419 Encounter for gynecological examination (general) (routine) without abnormal findings: Secondary | ICD-10-CM | POA: Diagnosis not present

## 2019-12-31 NOTE — Progress Notes (Signed)
   Kaitlyn Tate [birth year] [id number]   History:  64 y.o. G0 presents for annual exam. Postmenopausal - no HRT, no bleeding. Normal pap and mammogram history. Osteopenia, DEXA performed elsewhere 2020.   Gynecologic History Patient's last menstrual period was 09/13/2008.   Contraception: post menopausal status Last Pap: 12/2016. Results were: normal Last mammogram: 08/2019. Results were: normal Last colonoscopy: 2015. Results were: normal Last Dexa: 2020. Results were: osteopenia  Past medical history, past surgical history, family history and social history were all reviewed and documented in the EPIC chart.  ROS:  A ROS was performed and pertinent positives and negatives are included.  Exam:  Vitals:   12/31/19 0815  BP: 128/80  Weight: 135 lb (61.2 kg)  Height: 5\' 4"  (1.626 m)   Body mass index is 23.17 kg/m.  General appearance:  Normal Thyroid:  Symmetrical, normal in size, without palpable masses or nodularity. Respiratory  Auscultation:  Clear without wheezing or rhonchi Cardiovascular  Auscultation:  Regular rate, without rubs, murmurs or gallops  Edema/varicosities:  Not grossly evident Abdominal  Soft,nontender, without masses, guarding or rebound.  Liver/spleen:  No organomegaly noted  Hernia:  None appreciated  Skin  Inspection:  Grossly normal   Breasts: Examined lying and sitting.   Right: Without masses, retractions, discharge or axillary adenopathy.   Left: Without masses, retractions, discharge or axillary adenopathy. Gentitourinary   Inguinal/mons:  Normal without inguinal adenopathy  External genitalia:  Normal  BUS/Urethra/Skene's glands:  Normal  Vagina:  Atrophic changes  Cervix:  Normal  Uterus:  Normal in size, shape and contour.  Midline and mobile  Adnexa/parametria:     Rt: Without masses or tenderness.   Lt: Without masses or tenderness.  Anus and perineum: Normal  Digital rectal exam: Normal sphincter tone without palpated  masses or tenderness  Assessment/Plan:  64 y.o. G0 for annual exam.   Well female exam with routine gynecological exam - Education provided on SBEs, importance of preventative screenings, current guidelines, high calcium diet, regular exercise, and multivitamin daily. Labs with PCP.   Screening for cervical cancer -normal Pap history.  Pap with reflex performed today.  Screening for breast cancer -normal mammogram history.  Continue annual screenings.  Normal breast exam today.  Screening for colon cancer -normal colonoscopy in 2015.  She will repeat at [facility] recommended interval.  Screening for osteoporosis -DEXA in 2020 managed by PCP.  Patient reports osteopenia.  Recommend daily vitamin D supplement and regular exercise that incorporates resistance training.  Follow-up in 1 year for annual.    Tamela Gammon [facility], 8:24 AM 12/31/2019

## 2019-12-31 NOTE — Addendum Note (Signed)
Addended by: Lorine Bears on: 12/31/2019 09:07 AM   Modules accepted: Orders

## 2019-12-31 NOTE — Patient Instructions (Signed)

## 2020-01-01 LAB — PAP IG W/ RFLX HPV ASCU

## 2020-09-07 DIAGNOSIS — Z1231 Encounter for screening mammogram for malignant neoplasm of breast: Secondary | ICD-10-CM | POA: Diagnosis not present

## 2020-09-07 DIAGNOSIS — E7800 Pure hypercholesterolemia, unspecified: Secondary | ICD-10-CM | POA: Diagnosis not present

## 2020-09-07 DIAGNOSIS — R69 Illness, unspecified: Secondary | ICD-10-CM | POA: Diagnosis not present

## 2020-09-08 ENCOUNTER — Other Ambulatory Visit: Payer: Self-pay | Admitting: Physician Assistant

## 2020-09-08 DIAGNOSIS — M8580 Other specified disorders of bone density and structure, unspecified site: Secondary | ICD-10-CM

## 2020-09-08 DIAGNOSIS — Z780 Asymptomatic menopausal state: Secondary | ICD-10-CM

## 2020-11-04 DIAGNOSIS — K5792 Diverticulitis of intestine, part unspecified, without perforation or abscess without bleeding: Secondary | ICD-10-CM | POA: Diagnosis not present

## 2020-11-13 DIAGNOSIS — K5792 Diverticulitis of intestine, part unspecified, without perforation or abscess without bleeding: Secondary | ICD-10-CM | POA: Diagnosis not present

## 2020-12-31 ENCOUNTER — Other Ambulatory Visit: Payer: Self-pay

## 2020-12-31 ENCOUNTER — Ambulatory Visit (INDEPENDENT_AMBULATORY_CARE_PROVIDER_SITE_OTHER): Payer: 59 | Admitting: Nurse Practitioner

## 2020-12-31 VITALS — BP 128/82 | HR 80 | Resp 18 | Ht 64.0 in | Wt 146.6 lb

## 2020-12-31 DIAGNOSIS — M8580 Other specified disorders of bone density and structure, unspecified site: Secondary | ICD-10-CM | POA: Diagnosis not present

## 2020-12-31 DIAGNOSIS — Z780 Asymptomatic menopausal state: Secondary | ICD-10-CM

## 2020-12-31 DIAGNOSIS — Z01419 Encounter for gynecological examination (general) (routine) without abnormal findings: Secondary | ICD-10-CM

## 2020-12-31 NOTE — Progress Notes (Signed)
° °  Kaitlyn Tate 1955/06/16 [id number]   History:  65 y.o. G0 presents for annual exam. Postmenopausal - no HRT, no bleeding. Normal pap and mammogram history.  Gynecologic History Patient's last menstrual period was 09/13/2008.   Contraception: post menopausal status Sexually active: No  Health maintenance Last Pap: 12/31/2019. Results were: Normal, 3-year repeat Last mammogram: 09/10/2020. Results were: Normal Last colonoscopy: 2014. Results were: Normal, 10-year recall Last Dexa: 2020. Results were: Osteopenia per patient. Scheduled 02/2021  Past medical history, past surgical history, family history and social history were all reviewed and documented in the EPIC chart. Working part-time for [employer]. Sister diagnosed with colon cancer at age 63.  ROS:  A ROS was performed and pertinent positives and negatives are included.  Exam:  Vitals:   12/31/20 0819  BP: 128/82  Pulse: 80  Resp: 63  SpO2: 96%  Weight: 146 lb 9.6 oz (66.5 kg)  Height: 5\' 4"  (1.626 m)    Body mass index is 25.16 kg/m.  General appearance:  Normal Thyroid:  Symmetrical, normal in size, without palpable masses or nodularity. Respiratory  Auscultation:  Clear without wheezing or rhonchi Cardiovascular  Auscultation:  Regular rate, without rubs, murmurs or gallops  Edema/varicosities:  Not grossly evident Abdominal  Soft,nontender, without masses, guarding or rebound.  Liver/spleen:  No organomegaly noted  Hernia:  None appreciated  Skin  Inspection:  Grossly normal   Breasts: Examined lying and sitting.   Right: Without masses, retractions, discharge or axillary adenopathy.   Left: Without masses, retractions, discharge or axillary adenopathy. Genitourinary   Inguinal/mons:  Normal without inguinal adenopathy  External genitalia:  Normal appearing vulva with no masses, tenderness, or lesions. Atrophic changes  BUS/Urethra/Skene's glands:  Normal  Vagina:  Normal  appearing with normal color and discharge, no lesions. Atrophic changes  Cervix:  Normal appearing without discharge or lesions  Uterus:  Normal in size, shape and contour.  Midline and mobile, nontender  Adnexa/parametria:     Rt: Normal in size, without masses or tenderness.   Lt: Normal in size, without masses or tenderness.  Anus and perineum: Normal, non-bleeding hemorrhoids  Digital rectal exam: Normal sphincter tone without palpated masses or tenderness  Patient informed chaperone available to be present for breast and pelvic exam. Patient has requested no chaperone to be present. Patient has been advised what will be completed during breast and pelvic exam.   Assessment/Plan:  65 y.o. G0 for annual exam.   Well female exam with routine gynecological exam - Education provided on SBEs, importance of preventative screenings, current guidelines, high calcium diet, regular exercise, and multivitamin daily. Labs with PCP.   Postmenopausal - no HRT, no bleeding  Osteopenia, unspecified location - managed by PCP. DXA scheduled 02/2021. Recommend daily Vitamin D + calcium supplement and regular exercise.   Screening for cervical cancer - Normal Pap history.  Will repeat at 3-year interval per guidelines.   Screening for breast cancer - Normal mammogram history.  Continue annual screenings.  Normal breast exam today.  Screening for colon cancer - Normal colonoscopy in 2014.  She will repeat at 10-year interval per GI's recommendation.  Follow-up in 1 year for annual.    Tamela Gammon [facility], 8:51 AM 12/31/2020

## 2021-01-29 ENCOUNTER — Other Ambulatory Visit: Payer: Self-pay | Admitting: Physician Assistant

## 2021-01-29 DIAGNOSIS — Z1231 Encounter for screening mammogram for malignant neoplasm of breast: Secondary | ICD-10-CM

## 2021-03-01 ENCOUNTER — Other Ambulatory Visit: Payer: BLUE CROSS/BLUE SHIELD

## 2021-03-01 ENCOUNTER — Ambulatory Visit
Admission: RE | Admit: 2021-03-01 | Discharge: 2021-03-01 | Disposition: A | Discharge status: Home / Self Care | Payer: 59 | Source: Ambulatory Visit | Attending: Physician Assistant | Admitting: Physician Assistant

## 2021-03-01 DIAGNOSIS — M8589 Other specified disorders of bone density and structure, multiple sites: Secondary | ICD-10-CM | POA: Diagnosis not present

## 2021-03-01 DIAGNOSIS — Z780 Asymptomatic menopausal state: Secondary | ICD-10-CM

## 2021-03-01 DIAGNOSIS — M8580 Other specified disorders of bone density and structure, unspecified site: Secondary | ICD-10-CM

## 2021-09-08 DIAGNOSIS — E7800 Pure hypercholesterolemia, unspecified: Secondary | ICD-10-CM | POA: Diagnosis not present

## 2021-09-08 DIAGNOSIS — M8580 Other specified disorders of bone density and structure, unspecified site: Secondary | ICD-10-CM | POA: Diagnosis not present

## 2021-09-08 DIAGNOSIS — Z780 Asymptomatic menopausal state: Secondary | ICD-10-CM | POA: Diagnosis not present

## 2021-09-10 ENCOUNTER — Ambulatory Visit
Admission: RE | Admit: 2021-09-10 | Discharge: 2021-09-10 | Disposition: A | Discharge status: Home / Self Care | Payer: 59 | Source: Ambulatory Visit | Attending: Physician Assistant | Admitting: Physician Assistant

## 2021-09-10 DIAGNOSIS — Z1231 Encounter for screening mammogram for malignant neoplasm of breast: Secondary | ICD-10-CM

## 2021-09-14 ENCOUNTER — Other Ambulatory Visit: Payer: Self-pay | Admitting: Physician Assistant

## 2021-09-14 DIAGNOSIS — R928 Other abnormal and inconclusive findings on diagnostic imaging of breast: Secondary | ICD-10-CM

## 2021-09-22 ENCOUNTER — Other Ambulatory Visit: Payer: Self-pay | Admitting: Physician Assistant

## 2021-09-22 ENCOUNTER — Ambulatory Visit
Admission: RE | Admit: 2021-09-22 | Discharge: 2021-09-22 | Disposition: A | Discharge status: Home / Self Care | Payer: 59 | Source: Ambulatory Visit | Attending: Physician Assistant | Admitting: Physician Assistant

## 2021-09-22 DIAGNOSIS — M5412 Radiculopathy, cervical region: Secondary | ICD-10-CM | POA: Diagnosis not present

## 2021-09-22 DIAGNOSIS — M25511 Pain in right shoulder: Secondary | ICD-10-CM | POA: Diagnosis not present

## 2021-09-22 DIAGNOSIS — M542 Cervicalgia: Secondary | ICD-10-CM

## 2021-09-22 DIAGNOSIS — Z981 Arthrodesis status: Secondary | ICD-10-CM | POA: Diagnosis not present

## 2021-09-22 DIAGNOSIS — Z041 Encounter for examination and observation following transport accident: Secondary | ICD-10-CM | POA: Diagnosis not present

## 2021-09-30 ENCOUNTER — Ambulatory Visit
Admission: RE | Admit: 2021-09-30 | Discharge: 2021-09-30 | Disposition: A | Discharge status: Home / Self Care | Payer: 59 | Source: Ambulatory Visit | Attending: Physician Assistant | Admitting: Physician Assistant

## 2021-09-30 ENCOUNTER — Ambulatory Visit: Payer: 59

## 2021-09-30 DIAGNOSIS — R922 Inconclusive mammogram: Secondary | ICD-10-CM | POA: Diagnosis not present

## 2021-09-30 DIAGNOSIS — R928 Other abnormal and inconclusive findings on diagnostic imaging of breast: Secondary | ICD-10-CM

## 2021-11-18 DIAGNOSIS — S70361A Insect bite (nonvenomous), right thigh, initial encounter: Secondary | ICD-10-CM | POA: Diagnosis not present

## 2021-11-18 DIAGNOSIS — L821 Other seborrheic keratosis: Secondary | ICD-10-CM | POA: Diagnosis not present

## 2021-11-18 DIAGNOSIS — L814 Other melanin hyperpigmentation: Secondary | ICD-10-CM | POA: Diagnosis not present

## 2021-11-18 DIAGNOSIS — D171 Benign lipomatous neoplasm of skin and subcutaneous tissue of trunk: Secondary | ICD-10-CM | POA: Diagnosis not present

## 2021-11-18 DIAGNOSIS — L989 Disorder of the skin and subcutaneous tissue, unspecified: Secondary | ICD-10-CM | POA: Diagnosis not present

## 2021-11-18 DIAGNOSIS — L918 Other hypertrophic disorders of the skin: Secondary | ICD-10-CM | POA: Diagnosis not present

## 2021-11-18 DIAGNOSIS — C4441 Basal cell carcinoma of skin of scalp and neck: Secondary | ICD-10-CM | POA: Diagnosis not present

## 2021-11-18 DIAGNOSIS — D225 Melanocytic nevi of trunk: Secondary | ICD-10-CM | POA: Diagnosis not present

## 2021-11-25 ENCOUNTER — Other Ambulatory Visit: Payer: Self-pay | Admitting: Physician Assistant

## 2021-11-25 DIAGNOSIS — M5412 Radiculopathy, cervical region: Secondary | ICD-10-CM

## 2021-12-01 ENCOUNTER — Ambulatory Visit
Admission: RE | Admit: 2021-12-01 | Discharge: 2021-12-01 | Disposition: A | Discharge status: Home / Self Care | Payer: 59 | Source: Ambulatory Visit | Attending: Physician Assistant | Admitting: Physician Assistant

## 2021-12-01 DIAGNOSIS — M5412 Radiculopathy, cervical region: Secondary | ICD-10-CM

## 2021-12-01 DIAGNOSIS — M542 Cervicalgia: Secondary | ICD-10-CM | POA: Diagnosis not present

## 2021-12-03 DIAGNOSIS — M542 Cervicalgia: Secondary | ICD-10-CM | POA: Diagnosis not present

## 2021-12-03 DIAGNOSIS — S161XXA Strain of muscle, fascia and tendon at neck level, initial encounter: Secondary | ICD-10-CM | POA: Diagnosis not present

## 2021-12-03 DIAGNOSIS — Z6826 Body mass index (BMI) 26.0-26.9, adult: Secondary | ICD-10-CM | POA: Diagnosis not present

## 2021-12-12 ENCOUNTER — Other Ambulatory Visit: Payer: 59

## 2021-12-29 ENCOUNTER — Encounter: Payer: Self-pay | Admitting: Nurse Practitioner

## 2022-01-03 NOTE — Progress Notes (Unsigned)
   Kaitlyn Tate July 31, 1955 [id number]   History:  66 y.o. G0 presents for annual exam. Postmenopausal - no HRT, no bleeding. Normal pap and mammogram history.  Gynecologic History Patient's last menstrual period was 09/13/2008.   Contraception: post menopausal status Sexually active: No  Health maintenance Last Pap: 12/31/2019. Results were: Normal, 3-year repeat Last mammogram: 09/10/2021. Results were: Possible right breast asymmetry, U/S 09/30/21 Normal Last colonoscopy: 2014. Results were: Normal, 10-year recall Last Dexa: 03/01/2021. Results were: T-score -1.3, FRAX 7.9% / 0.5%  Past medical history, past surgical history, family history and social history were all reviewed and documented in the EPIC chart. Working part-time for [employer]. Sister diagnosed with colon cancer at age 39.  ROS:  A ROS was performed and pertinent positives and negatives are included.  Exam:  Vitals:   01/04/22 0829  BP: 102/72  Pulse: 81  SpO2: 94%  Weight: 152 lb (68.9 kg)  Height: 5' 4.25" (1.632 m)     Body mass index is 25.89 kg/m.  General appearance:  Normal Thyroid:  Symmetrical, normal in size, without palpable masses or nodularity. Respiratory  Auscultation:  Clear without wheezing or rhonchi Cardiovascular  Auscultation:  Regular rate, without rubs, murmurs or gallops  Edema/varicosities:  Not grossly evident Abdominal  Soft,nontender, without masses, guarding or rebound.  Liver/spleen:  No organomegaly noted  Hernia:  None appreciated  Skin  Inspection:  Grossly normal   Breasts: Examined lying and sitting.   Right: Without masses, retractions, discharge or axillary adenopathy.   Left: Without masses, retractions, discharge or axillary adenopathy. Genitourinary   Inguinal/mons:  Normal without inguinal adenopathy  External genitalia:  Normal appearing vulva with no masses, tenderness, or lesions. Atrophic changes  BUS/Urethra/Skene's glands:   Normal  Vagina:  Normal appearing with normal color and discharge, no lesions. Atrophic changes  Cervix:  Normal appearing without discharge or lesions  Uterus:  Normal in size, shape and contour.  Midline and mobile, nontender  Adnexa/parametria:     Rt: Normal in size, without masses or tenderness.   Lt: Normal in size, without masses or tenderness.  Anus and perineum: Normal, non-bleeding hemorrhoids  Digital rectal exam: Normal sphincter tone without palpated masses or tenderness  Patient informed chaperone available to be present for breast and pelvic exam. Patient has requested no chaperone to be present. Patient has been advised what will be completed during breast and pelvic exam.   Assessment/Plan:  66 y.o. G0 for annual exam.   Well female exam with routine gynecological exam - Education provided on SBEs, importance of preventative screenings, current guidelines, high calcium diet, regular exercise, and multivitamin daily. Labs with PCP.   Postmenopausal - no HRT, no bleeding  Osteopenia, unspecified location - Managed by PCP. T-score -1.3 in February 2023. Recommend daily Vitamin D + calcium supplement and regular exercise.   Screening for cervical cancer - Normal Pap history.  Will repeat at 3-year interval per guidelines.   Screening for breast cancer - Normal mammogram history.  Continue annual screenings.  Normal breast exam today.  Screening for colon cancer - Normal colonoscopy in 2014.  She will repeat at 10-year interval per GI's recommendation.  Follow-up in 1 year for annual.    Tamela Gammon [facility], 9:11 AM 01/04/2022

## 2022-01-04 ENCOUNTER — Encounter: Payer: Self-pay | Admitting: Nurse Practitioner

## 2022-01-04 ENCOUNTER — Ambulatory Visit (INDEPENDENT_AMBULATORY_CARE_PROVIDER_SITE_OTHER): Payer: 59 | Admitting: Nurse Practitioner

## 2022-01-04 VITALS — BP 102/72 | HR 81 | Ht 64.25 in | Wt 152.0 lb

## 2022-01-04 DIAGNOSIS — Z01419 Encounter for gynecological examination (general) (routine) without abnormal findings: Secondary | ICD-10-CM

## 2022-01-04 DIAGNOSIS — Z780 Asymptomatic menopausal state: Secondary | ICD-10-CM | POA: Diagnosis not present

## 2022-01-04 DIAGNOSIS — M8589 Other specified disorders of bone density and structure, multiple sites: Secondary | ICD-10-CM

## 2022-02-17 DIAGNOSIS — H00024 Hordeolum internum left upper eyelid: Secondary | ICD-10-CM | POA: Diagnosis not present

## 2022-04-14 DIAGNOSIS — E7800 Pure hypercholesterolemia, unspecified: Secondary | ICD-10-CM | POA: Diagnosis not present

## 2022-04-14 DIAGNOSIS — I10 Essential (primary) hypertension: Secondary | ICD-10-CM | POA: Diagnosis not present

## 2022-08-19 ENCOUNTER — Other Ambulatory Visit: Payer: Self-pay | Admitting: Physician Assistant

## 2022-08-19 DIAGNOSIS — Z1231 Encounter for screening mammogram for malignant neoplasm of breast: Secondary | ICD-10-CM

## 2022-10-18 ENCOUNTER — Ambulatory Visit: Payer: 59

## 2022-11-01 ENCOUNTER — Ambulatory Visit
Admission: RE | Admit: 2022-11-01 | Discharge: 2022-11-01 | Disposition: A | Discharge status: Home / Self Care | Payer: Medicare Other | Source: Ambulatory Visit | Attending: Physician Assistant | Admitting: Physician Assistant

## 2022-11-01 DIAGNOSIS — Z1231 Encounter for screening mammogram for malignant neoplasm of breast: Secondary | ICD-10-CM

## 2023-01-31 ENCOUNTER — Encounter: Payer: Self-pay | Admitting: Nurse Practitioner

## 2023-01-31 ENCOUNTER — Other Ambulatory Visit (HOSPITAL_COMMUNITY)
Admission: RE | Admit: 2023-01-31 | Discharge: 2023-01-31 | Disposition: A | Discharge status: Home / Self Care | Payer: Medicare Other | Source: Ambulatory Visit | Attending: Nurse Practitioner | Admitting: Nurse Practitioner

## 2023-01-31 ENCOUNTER — Ambulatory Visit (INDEPENDENT_AMBULATORY_CARE_PROVIDER_SITE_OTHER): Payer: Medicare Other | Admitting: Nurse Practitioner

## 2023-01-31 VITALS — BP 132/72 | HR 87 | Ht 63.75 in | Wt 158.0 lb

## 2023-01-31 DIAGNOSIS — Z124 Encounter for screening for malignant neoplasm of cervix: Secondary | ICD-10-CM

## 2023-01-31 DIAGNOSIS — Z01419 Encounter for gynecological examination (general) (routine) without abnormal findings: Secondary | ICD-10-CM | POA: Insufficient documentation

## 2023-01-31 DIAGNOSIS — M8589 Other specified disorders of bone density and structure, multiple sites: Secondary | ICD-10-CM

## 2023-01-31 DIAGNOSIS — Z1151 Encounter for screening for human papillomavirus (HPV): Secondary | ICD-10-CM | POA: Insufficient documentation

## 2023-01-31 DIAGNOSIS — Z780 Asymptomatic menopausal state: Secondary | ICD-10-CM

## 2023-01-31 NOTE — Progress Notes (Signed)
 Kaitlyn Tate January 05, 1956 [id number]   History:  68 y.o. G0 presents for annual exam. Postmenopausal - no HRT, no bleeding. Normal pap and mammogram history.  Gynecologic History Patient's last menstrual period was 09/13/2008.   Contraception: post menopausal status Sexually active: No  Health maintenance Last Pap: 12/31/2019. Results were: Normal, 3-year repeat Last mammogram: 11/01/2022. Results were: Normal Last colonoscopy: 07/08/2013. Results were: Normal, 10-year recall Last Dexa: 03/01/2021. Results were: T-score -1.3, FRAX 7.9% / 0.5%  Past medical history, past surgical history, family history and social history were all reviewed and documented in the EPIC chart. Working part-time for [employer]. Did publishing copy for 30 years. Sister diagnosed with colon cancer at age 26.  ROS:  A ROS was performed and pertinent positives and negatives are included.  Exam:  Vitals:   01/31/23 0757  BP: 132/72  Pulse: 87  SpO2: 100%  Weight: 158 lb (71.7 kg)  Height: 5' 3.75 (1.619 m)      Body mass index is 27.33 kg/m.  General appearance:  Normal Thyroid:  Symmetrical, normal in size, without palpable masses or nodularity. Respiratory  Auscultation:  Clear without wheezing or rhonchi Cardiovascular  Auscultation:  Regular rate, without rubs, murmurs or gallops  Edema/varicosities:  Not grossly evident Abdominal  Soft,nontender, without masses, guarding or rebound.  Liver/spleen:  No organomegaly noted  Hernia:  None appreciated  Skin  Inspection:  Grossly normal   Breasts: Examined lying and sitting.   Right: Without masses, retractions, discharge or axillary adenopathy.   Left: Without masses, retractions, discharge or axillary adenopathy. Pelvic: External genitalia:  no lesions              Urethra:  normal appearing urethra with no masses, tenderness or lesions              Bartholins and Skenes: normal                 Vagina:  normal appearing vagina with normal color and discharge, no lesions. Atrophic changes              Cervix: no lesions Bimanual Exam:  Uterus:  no masses or tenderness              Adnexa: no mass, fullness, tenderness              Rectovaginal: Deferred              Anus:  normal, no lesions  Patient informed chaperone available to be present for breast and pelvic exam. Patient has requested no chaperone to be present. Patient has been advised what will be completed during breast and pelvic exam.   Assessment/Plan:  68 y.o. G0 for annual exam.   Well female exam with routine gynecological exam - Education provided on SBEs, importance of preventative screenings, current guidelines, high calcium diet, regular exercise, and multivitamin daily. Labs with PCP.   Postmenopausal - no HRT, no bleeding  Osteopenia of multiple sites - Managed by PCP. T-score -1.3 in February 2023. Recommend daily Vitamin D + calcium supplement and regular exercise. Plans to schedule later in the year with her mammogram.   Cervical cancer screening - Plan: Cytology - PAP( [facility]). Normal Pap history.     Screening for breast cancer - Normal mammogram history.  Continue annual screenings.  Normal breast exam today.  Screening for colon cancer - Normal colonoscopy in 2015.  She will repeat at 10-year interval per GI's recommendation.  Return  in about 2 years (around 01/30/2025) for B&P.    Kaitlyn Tate [facility], 8:25 AM 01/31/2023

## 2023-02-01 ENCOUNTER — Encounter: Payer: Self-pay | Admitting: Nurse Practitioner

## 2023-02-01 LAB — CYTOLOGY - PAP
Comment: NEGATIVE
Diagnosis: NEGATIVE
High risk HPV: NEGATIVE

## 2023-02-02 ENCOUNTER — Other Ambulatory Visit (HOSPITAL_COMMUNITY): Payer: Self-pay | Admitting: Physician Assistant

## 2023-02-02 DIAGNOSIS — E7800 Pure hypercholesterolemia, unspecified: Secondary | ICD-10-CM

## 2023-02-12 ENCOUNTER — Encounter: Payer: Self-pay | Admitting: Nurse Practitioner

## 2023-02-13 NOTE — Telephone Encounter (Signed)
See previous encounter.   Billing notified to review and f/u with patient.   Encounter closed.

## 2023-02-24 ENCOUNTER — Ambulatory Visit (HOSPITAL_BASED_OUTPATIENT_CLINIC_OR_DEPARTMENT_OTHER)
Admission: RE | Admit: 2023-02-24 | Discharge: 2023-02-24 | Disposition: A | Discharge status: Home / Self Care | Payer: Self-pay | Source: Ambulatory Visit | Attending: Physician Assistant | Admitting: Physician Assistant

## 2023-02-24 DIAGNOSIS — E7800 Pure hypercholesterolemia, unspecified: Secondary | ICD-10-CM | POA: Insufficient documentation

## 2023-04-03 ENCOUNTER — Institutional Professional Consult (permissible substitution) (INDEPENDENT_AMBULATORY_CARE_PROVIDER_SITE_OTHER): Payer: Medicare Other | Admitting: Otolaryngology

## 2023-10-04 ENCOUNTER — Other Ambulatory Visit: Payer: Self-pay | Admitting: Physician Assistant

## 2023-10-04 DIAGNOSIS — Z1231 Encounter for screening mammogram for malignant neoplasm of breast: Secondary | ICD-10-CM

## 2023-10-16 ENCOUNTER — Ambulatory Visit (HOSPITAL_BASED_OUTPATIENT_CLINIC_OR_DEPARTMENT_OTHER)
Admission: RE | Admit: 2023-10-16 | Discharge: 2023-10-16 | Disposition: A | Discharge status: Home / Self Care | Source: Ambulatory Visit | Attending: Physician Assistant | Admitting: Physician Assistant

## 2023-10-16 ENCOUNTER — Other Ambulatory Visit (HOSPITAL_BASED_OUTPATIENT_CLINIC_OR_DEPARTMENT_OTHER): Payer: Self-pay | Admitting: Physician Assistant

## 2023-10-16 DIAGNOSIS — R1032 Left lower quadrant pain: Secondary | ICD-10-CM | POA: Diagnosis present

## 2023-10-16 MED ORDER — IOHEXOL 300 MG/ML  SOLN
100.0000 mL | Freq: Once | INTRAMUSCULAR | Status: AC | PRN
  Administered 2023-10-16: 85 mL via INTRAVENOUS

## 2023-11-07 ENCOUNTER — Other Ambulatory Visit (HOSPITAL_BASED_OUTPATIENT_CLINIC_OR_DEPARTMENT_OTHER): Payer: Self-pay | Admitting: Physician Assistant

## 2023-11-07 DIAGNOSIS — Z13820 Encounter for screening for osteoporosis: Secondary | ICD-10-CM

## 2023-11-09 ENCOUNTER — Ambulatory Visit

## 2023-11-10 ENCOUNTER — Ambulatory Visit
Admission: RE | Admit: 2023-11-10 | Discharge: 2023-11-10 | Disposition: A | Discharge status: Home / Self Care | Source: Ambulatory Visit | Attending: Physician Assistant | Admitting: Physician Assistant

## 2023-11-10 DIAGNOSIS — Z1231 Encounter for screening mammogram for malignant neoplasm of breast: Secondary | ICD-10-CM

## 2024-04-04 ENCOUNTER — Ambulatory Visit (HOSPITAL_BASED_OUTPATIENT_CLINIC_OR_DEPARTMENT_OTHER)
